# Patient Record
Sex: Female | Born: 1955
Health system: Southern US, Community
[De-identification: ages and names within clinical notes are randomized; demographics above are authoritative.]

## PROBLEM LIST (undated history)

## (undated) DIAGNOSIS — G709 Myoneural disorder, unspecified: Secondary | ICD-10-CM

## (undated) DIAGNOSIS — E079 Disorder of thyroid, unspecified: Secondary | ICD-10-CM

## (undated) DIAGNOSIS — F329 Major depressive disorder, single episode, unspecified: Secondary | ICD-10-CM

## (undated) DIAGNOSIS — C4490 Unspecified malignant neoplasm of skin, unspecified: Secondary | ICD-10-CM

## (undated) DIAGNOSIS — G8929 Other chronic pain: Secondary | ICD-10-CM

## (undated) DIAGNOSIS — M1990 Unspecified osteoarthritis, unspecified site: Secondary | ICD-10-CM

## (undated) DIAGNOSIS — K219 Gastro-esophageal reflux disease without esophagitis: Secondary | ICD-10-CM

## (undated) DIAGNOSIS — R031 Nonspecific low blood-pressure reading: Secondary | ICD-10-CM

## (undated) DIAGNOSIS — M729 Fibroblastic disorder, unspecified: Secondary | ICD-10-CM

## (undated) DIAGNOSIS — F32A Depression, unspecified: Secondary | ICD-10-CM

## (undated) DIAGNOSIS — E039 Hypothyroidism, unspecified: Secondary | ICD-10-CM

## (undated) DIAGNOSIS — F419 Anxiety disorder, unspecified: Secondary | ICD-10-CM

## (undated) DIAGNOSIS — Z9289 Personal history of other medical treatment: Secondary | ICD-10-CM

## (undated) HISTORY — PX: TOTAL KNEE ARTHROPLASTY: SHX125

## (undated) HISTORY — PX: DILATION AND CURETTAGE OF UTERUS: SHX78

## (undated) HISTORY — PX: BACK SURGERY: SHX140

## (undated) HISTORY — PX: HERNIA REPAIR: SHX51

---

## 2015-06-28 DIAGNOSIS — M545 Low back pain: Secondary | ICD-10-CM | POA: Diagnosis not present

## 2015-06-28 DIAGNOSIS — Z79899 Other long term (current) drug therapy: Secondary | ICD-10-CM | POA: Diagnosis not present

## 2015-06-28 DIAGNOSIS — M25561 Pain in right knee: Secondary | ICD-10-CM | POA: Diagnosis not present

## 2015-06-28 DIAGNOSIS — Z5181 Encounter for therapeutic drug level monitoring: Secondary | ICD-10-CM | POA: Diagnosis not present

## 2015-06-28 DIAGNOSIS — G8929 Other chronic pain: Secondary | ICD-10-CM | POA: Diagnosis not present

## 2015-06-28 DIAGNOSIS — M25562 Pain in left knee: Secondary | ICD-10-CM | POA: Diagnosis not present

## 2015-07-29 DIAGNOSIS — F4323 Adjustment disorder with mixed anxiety and depressed mood: Secondary | ICD-10-CM | POA: Diagnosis not present

## 2015-07-29 DIAGNOSIS — Z9189 Other specified personal risk factors, not elsewhere classified: Secondary | ICD-10-CM | POA: Diagnosis not present

## 2015-07-29 DIAGNOSIS — Z636 Dependent relative needing care at home: Secondary | ICD-10-CM | POA: Diagnosis not present

## 2015-08-23 DIAGNOSIS — M545 Low back pain: Secondary | ICD-10-CM | POA: Diagnosis not present

## 2015-08-23 DIAGNOSIS — M25561 Pain in right knee: Secondary | ICD-10-CM | POA: Diagnosis not present

## 2015-08-23 DIAGNOSIS — M961 Postlaminectomy syndrome, not elsewhere classified: Secondary | ICD-10-CM | POA: Diagnosis not present

## 2015-08-23 DIAGNOSIS — M25562 Pain in left knee: Secondary | ICD-10-CM | POA: Diagnosis not present

## 2015-08-25 DIAGNOSIS — R079 Chest pain, unspecified: Secondary | ICD-10-CM | POA: Diagnosis not present

## 2015-08-25 DIAGNOSIS — R002 Palpitations: Secondary | ICD-10-CM | POA: Diagnosis not present

## 2015-08-25 DIAGNOSIS — R0602 Shortness of breath: Secondary | ICD-10-CM | POA: Diagnosis not present

## 2015-09-07 DIAGNOSIS — R079 Chest pain, unspecified: Secondary | ICD-10-CM | POA: Diagnosis not present

## 2015-09-07 DIAGNOSIS — R002 Palpitations: Secondary | ICD-10-CM | POA: Diagnosis not present

## 2015-09-07 DIAGNOSIS — R0602 Shortness of breath: Secondary | ICD-10-CM | POA: Diagnosis not present

## 2015-10-18 DIAGNOSIS — M25512 Pain in left shoulder: Secondary | ICD-10-CM | POA: Diagnosis not present

## 2015-10-18 DIAGNOSIS — M069 Rheumatoid arthritis, unspecified: Secondary | ICD-10-CM | POA: Diagnosis not present

## 2015-10-18 DIAGNOSIS — M2550 Pain in unspecified joint: Secondary | ICD-10-CM | POA: Diagnosis not present

## 2015-10-18 DIAGNOSIS — M545 Low back pain: Secondary | ICD-10-CM | POA: Diagnosis not present

## 2015-10-26 DIAGNOSIS — R635 Abnormal weight gain: Secondary | ICD-10-CM | POA: Diagnosis not present

## 2015-10-26 DIAGNOSIS — E559 Vitamin D deficiency, unspecified: Secondary | ICD-10-CM | POA: Diagnosis not present

## 2015-10-26 DIAGNOSIS — I10 Essential (primary) hypertension: Secondary | ICD-10-CM | POA: Diagnosis not present

## 2015-10-26 DIAGNOSIS — E039 Hypothyroidism, unspecified: Secondary | ICD-10-CM | POA: Diagnosis not present

## 2015-10-26 DIAGNOSIS — M069 Rheumatoid arthritis, unspecified: Secondary | ICD-10-CM | POA: Diagnosis not present

## 2015-11-02 DIAGNOSIS — M25572 Pain in left ankle and joints of left foot: Secondary | ICD-10-CM | POA: Diagnosis not present

## 2015-11-02 DIAGNOSIS — S93411A Sprain of calcaneofibular ligament of right ankle, initial encounter: Secondary | ICD-10-CM | POA: Diagnosis not present

## 2015-11-02 DIAGNOSIS — G8929 Other chronic pain: Secondary | ICD-10-CM | POA: Diagnosis not present

## 2015-11-02 DIAGNOSIS — M25561 Pain in right knee: Secondary | ICD-10-CM | POA: Diagnosis not present

## 2015-11-02 DIAGNOSIS — M1712 Unilateral primary osteoarthritis, left knee: Secondary | ICD-10-CM | POA: Diagnosis not present

## 2015-11-22 DIAGNOSIS — Z1231 Encounter for screening mammogram for malignant neoplasm of breast: Secondary | ICD-10-CM | POA: Diagnosis not present

## 2015-12-02 DIAGNOSIS — Z01818 Encounter for other preprocedural examination: Secondary | ICD-10-CM | POA: Diagnosis not present

## 2015-12-02 DIAGNOSIS — M1712 Unilateral primary osteoarthritis, left knee: Secondary | ICD-10-CM | POA: Diagnosis not present

## 2015-12-02 DIAGNOSIS — M25562 Pain in left knee: Secondary | ICD-10-CM | POA: Diagnosis not present

## 2015-12-07 DIAGNOSIS — M545 Low back pain: Secondary | ICD-10-CM | POA: Diagnosis not present

## 2015-12-07 DIAGNOSIS — Z79899 Other long term (current) drug therapy: Secondary | ICD-10-CM | POA: Diagnosis not present

## 2015-12-07 DIAGNOSIS — M2550 Pain in unspecified joint: Secondary | ICD-10-CM | POA: Diagnosis not present

## 2015-12-07 DIAGNOSIS — M069 Rheumatoid arthritis, unspecified: Secondary | ICD-10-CM | POA: Diagnosis not present

## 2016-01-24 ENCOUNTER — Encounter (HOSPITAL_BASED_OUTPATIENT_CLINIC_OR_DEPARTMENT_OTHER): Payer: Self-pay | Admitting: Emergency Medicine

## 2016-01-24 ENCOUNTER — Emergency Department (HOSPITAL_BASED_OUTPATIENT_CLINIC_OR_DEPARTMENT_OTHER)
Admission: EM | Admit: 2016-01-24 | Discharge: 2016-01-24 | Disposition: A | Discharge status: Home / Self Care | Payer: PPO | Attending: Emergency Medicine | Admitting: Emergency Medicine

## 2016-01-24 ENCOUNTER — Emergency Department (HOSPITAL_BASED_OUTPATIENT_CLINIC_OR_DEPARTMENT_OTHER): Payer: PPO

## 2016-01-24 DIAGNOSIS — R109 Unspecified abdominal pain: Secondary | ICD-10-CM | POA: Diagnosis not present

## 2016-01-24 DIAGNOSIS — K5712 Diverticulitis of small intestine without perforation or abscess without bleeding: Secondary | ICD-10-CM

## 2016-01-24 DIAGNOSIS — Z79899 Other long term (current) drug therapy: Secondary | ICD-10-CM | POA: Insufficient documentation

## 2016-01-24 DIAGNOSIS — K5792 Diverticulitis of intestine, part unspecified, without perforation or abscess without bleeding: Secondary | ICD-10-CM | POA: Insufficient documentation

## 2016-01-24 HISTORY — DX: Unspecified malignant neoplasm of skin, unspecified: C44.90

## 2016-01-24 HISTORY — DX: Unspecified osteoarthritis, unspecified site: M19.90

## 2016-01-24 HISTORY — DX: Fibroblastic disorder, unspecified: M72.9

## 2016-01-24 HISTORY — DX: Disorder of thyroid, unspecified: E07.9

## 2016-01-24 LAB — CBC WITH DIFFERENTIAL/PLATELET
BASOS PCT: 0 %
Basophils Absolute: 0 10*3/uL (ref 0.0–0.1)
EOS ABS: 0.1 10*3/uL (ref 0.0–0.7)
EOS PCT: 0 %
HCT: 37.3 % (ref 36.0–46.0)
Hemoglobin: 12.2 g/dL (ref 12.0–15.0)
Lymphocytes Relative: 11 %
Lymphs Abs: 1.3 10*3/uL (ref 0.7–4.0)
MCH: 29.9 pg (ref 26.0–34.0)
MCHC: 32.7 g/dL (ref 30.0–36.0)
MCV: 91.4 fL (ref 78.0–100.0)
MONO ABS: 0.7 10*3/uL (ref 0.1–1.0)
MONOS PCT: 6 %
NEUTROS PCT: 83 %
Neutro Abs: 9.3 10*3/uL — ABNORMAL HIGH (ref 1.7–7.7)
Platelets: 246 10*3/uL (ref 150–400)
RBC: 4.08 MIL/uL (ref 3.87–5.11)
RDW: 14 % (ref 11.5–15.5)
WBC: 11.3 10*3/uL — ABNORMAL HIGH (ref 4.0–10.5)

## 2016-01-24 LAB — COMPREHENSIVE METABOLIC PANEL
ALBUMIN: 3.8 g/dL (ref 3.5–5.0)
ALK PHOS: 66 U/L (ref 38–126)
ALT: 13 U/L — ABNORMAL LOW (ref 14–54)
ANION GAP: 9 (ref 5–15)
AST: 18 U/L (ref 15–41)
BILIRUBIN TOTAL: 0.5 mg/dL (ref 0.3–1.2)
BUN: 16 mg/dL (ref 6–20)
CALCIUM: 8.6 mg/dL — AB (ref 8.9–10.3)
CO2: 20 mmol/L — ABNORMAL LOW (ref 22–32)
Chloride: 108 mmol/L (ref 101–111)
Creatinine, Ser: 0.93 mg/dL (ref 0.44–1.00)
GFR calc Af Amer: >60 mL/min (ref >60)
GFR calc non Af Amer: >60 mL/min (ref >60)
GLUCOSE: 153 mg/dL — AB (ref 65–99)
Potassium: 3.6 mmol/L (ref 3.5–5.1)
Sodium: 137 mmol/L (ref 135–145)
TOTAL PROTEIN: 7.1 g/dL (ref 6.5–8.1)

## 2016-01-24 LAB — URINALYSIS, ROUTINE W REFLEX MICROSCOPIC
Glucose, UA: NEGATIVE mg/dL
Hgb urine dipstick: NEGATIVE
Ketones, ur: NEGATIVE mg/dL
NITRITE: NEGATIVE
PH: 6 (ref 5.0–8.0)
Protein, ur: NEGATIVE mg/dL
SPECIFIC GRAVITY, URINE: 1.02 (ref 1.005–1.030)

## 2016-01-24 LAB — URINE MICROSCOPIC-ADD ON

## 2016-01-24 LAB — LIPASE, BLOOD: Lipase: 20 U/L (ref 11–51)

## 2016-01-24 MED ORDER — METRONIDAZOLE 500 MG PO TABS: 500.0000 mg | ORAL_TABLET | Freq: Two times a day (BID) | ORAL | 0 refills | Status: DC

## 2016-01-24 MED ORDER — SODIUM CHLORIDE 0.9 % IV BOLUS (SEPSIS)
1000.0000 mL | Freq: Once | INTRAVENOUS | Status: AC
  Administered 2016-01-24: 1000 mL via INTRAVENOUS

## 2016-01-24 MED ORDER — OMEPRAZOLE 20 MG PO CPDR: 20.0000 mg | DELAYED_RELEASE_CAPSULE | Freq: Every day | ORAL | 0 refills | Status: DC

## 2016-01-24 MED ORDER — CIPROFLOXACIN HCL 500 MG PO TABS: 500.0000 mg | ORAL_TABLET | Freq: Three times a day (TID) | ORAL | 0 refills | Status: DC

## 2016-01-24 MED FILL — OMEPRAZOLE DR 20 MG CAPSULE: 20 | 30 days supply | Qty: 30 | Fill #0

## 2016-01-24 MED FILL — CIPROFLOXACIN HCL 500 MG TA: 500 | 7 days supply | Qty: 21 | Fill #0

## 2016-01-24 MED FILL — metroNIDAZOLE 500 MG TABS: 500 | 7 days supply | Qty: 14 | Fill #0

## 2016-01-24 NOTE — Discharge Instructions (Signed)
Cipro and Flagyl as prescribed.  Prilosec as prescribed.  You are to follow-up at Dr. Lorie Apley office at 8:30 Wednesday morning. They should call you to schedule this appointment. The contact information for her office has been provided in this discharge summary for you to call if you have not heard from them by noon tomorrow.

## 2016-01-24 NOTE — ED Provider Notes (Signed)
[facility] Provider Note   CSN: [id number] Arrival date & time: 01/24/16  1212  First Provider Contact:  None       History   Chief Complaint Chief Complaint  Patient presents with  . Flank Pain    HPI Messina Marren is a 60 y.o. female.  Patient is a 60 year old female who presents here with complaints of right flank pain that started several days ago. It has been constant in nature and is worsening. She denies any fevers or chills. She denies any urinary complaints. She denies any vomiting, diarrhea, or constipation.   The history is provided by the patient.  Flank Pain  This is a new problem. Episode onset: 3 days ago. The problem occurs constantly. The problem has been gradually worsening. Associated symptoms include abdominal pain. Pertinent negatives include no chest pain. Nothing aggravates the symptoms. Nothing relieves the symptoms.    Past Medical History:  Diagnosis Date  . Arthritis   . Fasciitis   . Skin cancer   . Thyroid disease     There are no active problems to display for this patient.   Past Surgical History:  Procedure Laterality Date  . BACK SURGERY    . HERNIA REPAIR    . TOTAL KNEE ARTHROPLASTY      OB History    No data available       Home Medications    Prior to Admission medications   Medication Sig Start Date End Date Taking? Authorizing Provider  amitriptyline (ELAVIL) 10 MG tablet Take 10 mg by mouth at bedtime.   Yes Historical Provider, MD  cyclobenzaprine (FLEXERIL) 10 MG tablet Take 10 mg by mouth 3 (three) times daily as needed for muscle spasms.   Yes Historical Provider, MD  DULoxetine (CYMBALTA) 30 MG capsule Take 30 mg by mouth daily.   Yes Historical Provider, MD  FLUoxetine (PROZAC) 20 MG capsule Take 20 mg by mouth daily.   Yes Historical Provider, MD  HYDROmorphone (DILAUDID) 4 MG tablet Take by mouth every 4 (four) hours as needed for severe pain.   Yes Historical Provider, MD  LIOTHYRONINE SODIUM PO  Take by mouth.   Yes Historical Provider, MD  LORazepam (ATIVAN) 1 MG tablet Take 1 mg by mouth as needed for anxiety.   Yes Historical Provider, MD  meloxicam (MOBIC) 7.5 MG tablet Take 7.5 mg by mouth daily.   Yes Historical Provider, MD  oxycodone (ROXICODONE) 30 MG immediate release tablet Take 80 mg by mouth every 4 (four) hours as needed for pain.   Yes Historical Provider, MD  predniSONE (DELTASONE) 10 MG tablet Take 10 mg by mouth daily with breakfast.   Yes Historical Provider, MD  topiramate (TOPAMAX) 100 MG tablet Take 100 mg by mouth 2 (two) times daily.   Yes Historical Provider, MD    Family History History reviewed. No pertinent family history.  Social History Social History  Substance Use Topics  . Smoking status: Never Smoker  . Smokeless tobacco: Never Used  . Alcohol use No     Allergies   Penicillins   Review of Systems Review of Systems  Cardiovascular: Negative for chest pain.  Gastrointestinal: Positive for abdominal pain.  Genitourinary: Positive for flank pain.  All other systems reviewed and are negative.    Physical Exam Updated Vital Signs BP 128/70 (BP Location: Right Arm)   Pulse 112   Temp 98.4 F (36.9 C) (Oral)   Resp 18   Ht 5\' 6"  (1.676 m)  Wt 245 lb (111.1 kg)   SpO2 100%   BMI 39.54 kg/m   Physical Exam  Constitutional: She is oriented to person, place, and time. She appears well-developed and well-nourished. No distress.  HENT:  Head: Normocephalic and atraumatic.  Neck: Normal range of motion. Neck supple.  Cardiovascular: Normal rate and regular rhythm.  Exam reveals no gallop and no friction rub.   No murmur heard. Pulmonary/Chest: Effort normal and breath sounds normal. No respiratory distress. She has no wheezes.  Abdominal: Soft. Bowel sounds are normal. She exhibits no distension. There is no tenderness.  Musculoskeletal: Normal range of motion.  Neurological: She is alert and oriented to person, place, and time.    Skin: Skin is warm and dry. She is not diaphoretic.  Nursing note and vitals reviewed.    ED Treatments / Results  Labs (all labs ordered are listed, but only abnormal results are displayed) Labs Reviewed  URINALYSIS, ROUTINE W REFLEX MICROSCOPIC (NOT AT [facility]) - Abnormal; Notable for the following:       Result Value   Color, Urine AMBER (*)    APPearance CLOUDY (*)    Bilirubin Urine SMALL (*)    Leukocytes, UA SMALL (*)    All other components within normal limits  COMPREHENSIVE METABOLIC PANEL - Abnormal; Notable for the following:    CO2 20 (*)    Glucose, Bld 153 (*)    Calcium 8.6 (*)    ALT 13 (*)    All other components within normal limits  CBC WITH DIFFERENTIAL/PLATELET - Abnormal; Notable for the following:    WBC 11.3 (*)    Neutro Abs 9.3 (*)    All other components within normal limits  URINE MICROSCOPIC-ADD ON - Abnormal; Notable for the following:    Squamous Epithelial / LPF 0-5 (*)    Bacteria, UA MANY (*)    Casts HYALINE CASTS (*)    All other components within normal limits  LIPASE, BLOOD    EKG  EKG Interpretation None       Radiology Ct Renal Stone Study  Result Date: 01/24/2016 CLINICAL DATA:  Right flank pain and tenderness EXAM: CT ABDOMEN AND PELVIS WITHOUT CONTRAST TECHNIQUE: Multidetector CT imaging of the abdomen and pelvis was performed following the standard protocol without IV contrast. COMPARISON:  None. FINDINGS: Lower chest:  No acute findings. Hepatobiliary: No mass visualized on this un-enhanced exam. Pancreas: No mass or inflammatory process identified on this un-enhanced exam. Spleen: Within normal limits in size. Adrenals/Urinary Tract: No evidence of urolithiasis or hydronephrosis. Left renal cyst is identified which is incompletely characterized without IV contrast material measuring 9 mm. No kidney stones or hydronephrosis. No hydroureter. Urinary bladder is normal. No definite mass visualized on this un-enhanced exam.  Stomach/Bowel: Normal appearance of the stomach. There is a collection of gas which extends beyond the normal luminal contours of the duodenum measuring 1.9 by 1.0 cm, image 37 of series 2. There is fat stranding surrounding this collection of gas. No free perforation of gas identified within the upper abdomen. The small bowel loops are otherwise normal in appearance. There is no pathologic dilatation of the colon. Vascular/Lymphatic: Calcified atherosclerotic disease involves the abdominal aorta. No aneurysm. No enlarged retroperitoneal or mesenteric adenopathy. No enlarged pelvic or inguinal lymph nodes. Reproductive: The uterus and the adnexal structures are unremarkable. Other: There is no ascites or focal fluid collections within the abdomen or pelvis. Musculoskeletal: Status post posterior hardware fusion of the lumbar spine. IMPRESSION: 1. There  is inflammation surrounding a focal collection of gas associated with the third portion of the duodenum. This may represent a duodenal diverticulitis versus contained ulcer. Careful clinical correlation is recommended. No significant in pneumoperitoneum identified. No fluid collections identified. 2. No nephrolithiasis or obstructive uropathy. 3. Aortic atherosclerosis. Electronically Signed   By: Kerby Moors M.D.   On: 01/24/2016 13:28    Procedures Procedures (including critical care time)  Medications Ordered in ED Medications  sodium chloride 0.9 % bolus 1,000 mL (1,000 mLs Intravenous New Bag/Given 01/24/16 1307)     Initial Impression / Assessment and Plan / ED Course  I have reviewed the triage vital signs and the nursing notes.  Pertinent labs & imaging results that were available during my care of the patient were reviewed by me and considered in my medical decision making (see chart for details).  Clinical Course      Final Clinical Impressions(s) / ED Diagnoses   Final diagnoses:  None   Workup reveals no evidence for infection  in the urine. She does have a slight leukocytosis and CT scan shows either duodenal diverticulitis or possibly ulcer formation. She will be treated with Cipro and Flagyl as well as Prilosec and advised to follow-up with gastroenterology.  I have discussed the case with Dr. Collene Mares from gastroenterology who is comfortable with the disposition and will see to it that the patient gets follow-up in the next 2 days.  New Prescriptions New Prescriptions   No medications on file     Veryl Speak, MD 01/24/16 1429

## 2016-01-24 NOTE — ED Notes (Signed)
Patient transported to CT 

## 2016-02-01 DIAGNOSIS — M545 Low back pain: Secondary | ICD-10-CM | POA: Diagnosis not present

## 2016-02-01 DIAGNOSIS — M2550 Pain in unspecified joint: Secondary | ICD-10-CM | POA: Diagnosis not present

## 2016-02-01 DIAGNOSIS — M069 Rheumatoid arthritis, unspecified: Secondary | ICD-10-CM | POA: Diagnosis not present

## 2016-02-01 DIAGNOSIS — M25562 Pain in left knee: Secondary | ICD-10-CM | POA: Diagnosis not present

## 2016-02-03 ENCOUNTER — Other Ambulatory Visit: Payer: Self-pay | Admitting: Gastroenterology

## 2016-02-03 DIAGNOSIS — R935 Abnormal findings on diagnostic imaging of other abdominal regions, including retroperitoneum: Secondary | ICD-10-CM

## 2016-02-03 DIAGNOSIS — K5900 Constipation, unspecified: Secondary | ICD-10-CM | POA: Diagnosis not present

## 2016-02-03 DIAGNOSIS — K625 Hemorrhage of anus and rectum: Secondary | ICD-10-CM | POA: Diagnosis not present

## 2016-02-03 DIAGNOSIS — K219 Gastro-esophageal reflux disease without esophagitis: Secondary | ICD-10-CM | POA: Diagnosis not present

## 2016-02-03 DIAGNOSIS — R140 Abdominal distension (gaseous): Secondary | ICD-10-CM | POA: Diagnosis not present

## 2016-02-14 ENCOUNTER — Ambulatory Visit
Admission: RE | Admit: 2016-02-14 | Discharge: 2016-02-14 | Disposition: A | Discharge status: Home / Self Care | Payer: PPO | Source: Ambulatory Visit | Attending: Gastroenterology | Admitting: Gastroenterology

## 2016-02-14 ENCOUNTER — Other Ambulatory Visit: Payer: Self-pay

## 2016-02-14 DIAGNOSIS — K5732 Diverticulitis of large intestine without perforation or abscess without bleeding: Secondary | ICD-10-CM | POA: Diagnosis not present

## 2016-02-14 DIAGNOSIS — R935 Abnormal findings on diagnostic imaging of other abdominal regions, including retroperitoneum: Secondary | ICD-10-CM

## 2016-02-14 MED ORDER — IOPAMIDOL (ISOVUE-300) INJECTION 61%
125.0000 mL | Freq: Once | INTRAVENOUS | Status: AC | PRN
  Administered 2016-02-14: 125 mL via INTRAVENOUS

## 2016-02-17 DIAGNOSIS — Z1211 Encounter for screening for malignant neoplasm of colon: Secondary | ICD-10-CM | POA: Diagnosis not present

## 2016-02-17 DIAGNOSIS — Z1212 Encounter for screening for malignant neoplasm of rectum: Secondary | ICD-10-CM | POA: Diagnosis not present

## 2016-03-08 ENCOUNTER — Other Ambulatory Visit: Payer: Self-pay | Admitting: Gastroenterology

## 2016-03-16 ENCOUNTER — Encounter (HOSPITAL_COMMUNITY): Payer: Self-pay | Admitting: *Deleted

## 2016-03-20 NOTE — Anesthesia Preprocedure Evaluation (Addendum)
Anesthesia Evaluation  Patient identified by MRN, date of birth, ID band Patient awake    Reviewed: Allergy & Precautions, NPO status , Patient's Chart, lab work & pertinent test results  History of Anesthesia Complications Negative for: history of anesthetic complications  Airway Mallampati: II  TM Distance: >3 FB Neck ROM: Full    Dental  (+) Dental Advisory Given, Partial Upper,    Pulmonary neg pulmonary ROS,    Pulmonary exam normal breath sounds clear to auscultation       Cardiovascular (-) anginanegative cardio ROS   Rhythm:Regular Rate:Normal     Neuro/Psych neg Seizures PSYCHIATRIC DISORDERS Anxiety Depression chronic pain from RA, on hydromorphone; spinal stenosis s/p L5-S1 fusion   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)    GI/Hepatic Neg liver ROS, GERD  Medicated,  Endo/Other  neg diabetesHypothyroidism Morbid obesity  Renal/GU negative Renal ROS     Musculoskeletal  (+) Arthritis , Rheumatoid disorders,  Fibromyalgia -  Abdominal (+) + obese,   Peds  Hematology negative hematology ROS (+)   Anesthesia Other Findings   Reproductive/Obstetrics                           Anesthesia Physical Anesthesia Plan  ASA: III  Anesthesia Plan: MAC   Post-op Pain Management:    Induction: Intravenous  Airway Management Planned: Natural Airway and Nasal Cannula  Additional Equipment:   Intra-op Plan:   Post-operative Plan:   Informed Consent: I have reviewed the patients History and Physical, chart, labs and discussed the procedure including the risks, benefits and alternatives for the proposed anesthesia with the patient or authorized representative who has indicated his/her understanding and acceptance.   Dental advisory given  Plan Discussed with: CRNA  Anesthesia Plan Comments:        Anesthesia Quick Evaluation

## 2016-03-21 ENCOUNTER — Ambulatory Visit (HOSPITAL_COMMUNITY): Payer: PPO | Admitting: Anesthesiology

## 2016-03-21 ENCOUNTER — Encounter (HOSPITAL_COMMUNITY)
Admission: RE | Disposition: A | Discharge status: Home / Self Care | Payer: Self-pay | Source: Ambulatory Visit | Attending: Gastroenterology

## 2016-03-21 ENCOUNTER — Encounter (HOSPITAL_COMMUNITY): Payer: Self-pay

## 2016-03-21 ENCOUNTER — Ambulatory Visit (HOSPITAL_COMMUNITY)
Admission: RE | Admit: 2016-03-21 | Discharge: 2016-03-21 | Disposition: A | Discharge status: Home / Self Care | Payer: PPO | Source: Ambulatory Visit | Attending: Gastroenterology | Admitting: Gastroenterology

## 2016-03-21 DIAGNOSIS — F329 Major depressive disorder, single episode, unspecified: Secondary | ICD-10-CM | POA: Diagnosis not present

## 2016-03-21 DIAGNOSIS — R151 Fecal smearing: Secondary | ICD-10-CM | POA: Diagnosis not present

## 2016-03-21 DIAGNOSIS — Z981 Arthrodesis status: Secondary | ICD-10-CM | POA: Insufficient documentation

## 2016-03-21 DIAGNOSIS — M069 Rheumatoid arthritis, unspecified: Secondary | ICD-10-CM | POA: Diagnosis not present

## 2016-03-21 DIAGNOSIS — Z6839 Body mass index (BMI) 39.0-39.9, adult: Secondary | ICD-10-CM | POA: Diagnosis not present

## 2016-03-21 DIAGNOSIS — M729 Fibroblastic disorder, unspecified: Secondary | ICD-10-CM | POA: Diagnosis not present

## 2016-03-21 DIAGNOSIS — K635 Polyp of colon: Secondary | ICD-10-CM | POA: Diagnosis not present

## 2016-03-21 DIAGNOSIS — K219 Gastro-esophageal reflux disease without esophagitis: Secondary | ICD-10-CM | POA: Diagnosis not present

## 2016-03-21 DIAGNOSIS — Z85828 Personal history of other malignant neoplasm of skin: Secondary | ICD-10-CM | POA: Diagnosis not present

## 2016-03-21 DIAGNOSIS — Z880 Allergy status to penicillin: Secondary | ICD-10-CM | POA: Diagnosis not present

## 2016-03-21 DIAGNOSIS — Z1211 Encounter for screening for malignant neoplasm of colon: Secondary | ICD-10-CM | POA: Diagnosis not present

## 2016-03-21 DIAGNOSIS — G8929 Other chronic pain: Secondary | ICD-10-CM | POA: Diagnosis not present

## 2016-03-21 DIAGNOSIS — D125 Benign neoplasm of sigmoid colon: Secondary | ICD-10-CM | POA: Insufficient documentation

## 2016-03-21 DIAGNOSIS — F419 Anxiety disorder, unspecified: Secondary | ICD-10-CM | POA: Diagnosis not present

## 2016-03-21 DIAGNOSIS — G709 Myoneural disorder, unspecified: Secondary | ICD-10-CM | POA: Insufficient documentation

## 2016-03-21 DIAGNOSIS — D127 Benign neoplasm of rectosigmoid junction: Secondary | ICD-10-CM | POA: Diagnosis not present

## 2016-03-21 DIAGNOSIS — D123 Benign neoplasm of transverse colon: Secondary | ICD-10-CM | POA: Diagnosis not present

## 2016-03-21 DIAGNOSIS — K621 Rectal polyp: Secondary | ICD-10-CM | POA: Diagnosis not present

## 2016-03-21 DIAGNOSIS — E039 Hypothyroidism, unspecified: Secondary | ICD-10-CM | POA: Insufficient documentation

## 2016-03-21 DIAGNOSIS — Z96651 Presence of right artificial knee joint: Secondary | ICD-10-CM | POA: Diagnosis not present

## 2016-03-21 HISTORY — DX: Hypothyroidism, unspecified: E03.9

## 2016-03-21 HISTORY — DX: Major depressive disorder, single episode, unspecified: F32.9

## 2016-03-21 HISTORY — DX: Personal history of other medical treatment: Z92.89

## 2016-03-21 HISTORY — DX: Anxiety disorder, unspecified: F41.9

## 2016-03-21 HISTORY — DX: Other chronic pain: G89.29

## 2016-03-21 HISTORY — DX: Nonspecific low blood-pressure reading: R03.1

## 2016-03-21 HISTORY — DX: Depression, unspecified: F32.A

## 2016-03-21 HISTORY — DX: Myoneural disorder, unspecified: G70.9

## 2016-03-21 HISTORY — PX: COLONOSCOPY WITH PROPOFOL: SHX5780

## 2016-03-21 HISTORY — DX: Gastro-esophageal reflux disease without esophagitis: K21.9

## 2016-03-21 SURGERY — COLONOSCOPY WITH PROPOFOL
Anesthesia: Monitor Anesthesia Care

## 2016-03-21 MED ORDER — SODIUM CHLORIDE 0.9 % IV SOLN: INTRAVENOUS | Status: DC

## 2016-03-21 MED ORDER — LIDOCAINE 2% (20 MG/ML) 5 ML SYRINGE
INTRAMUSCULAR | Status: AC
  Filled 2016-03-21: qty 10

## 2016-03-21 MED ORDER — ONDANSETRON HCL 4 MG/2ML IJ SOLN
INTRAMUSCULAR | Status: DC | PRN
  Administered 2016-03-21: 4 mg via INTRAVENOUS

## 2016-03-21 MED ORDER — LIDOCAINE 2% (20 MG/ML) 5 ML SYRINGE
INTRAMUSCULAR | Status: DC | PRN
  Administered 2016-03-21: 40 mg via INTRAVENOUS

## 2016-03-21 MED ORDER — LACTATED RINGERS IV SOLN
INTRAVENOUS | Status: DC
  Administered 2016-03-21: 1000 mL via INTRAVENOUS

## 2016-03-21 MED ORDER — PROPOFOL 10 MG/ML IV BOLUS
INTRAVENOUS | Status: AC
  Filled 2016-03-21: qty 60

## 2016-03-21 MED ORDER — PROPOFOL 500 MG/50ML IV EMUL
INTRAVENOUS | Status: DC | PRN
  Administered 2016-03-21: 150 ug/kg/min via INTRAVENOUS

## 2016-03-21 MED ORDER — ONDANSETRON HCL 4 MG/2ML IJ SOLN
INTRAMUSCULAR | Status: AC
  Filled 2016-03-21: qty 2

## 2016-03-21 MED ORDER — PROPOFOL 10 MG/ML IV BOLUS
INTRAVENOUS | Status: DC | PRN
  Administered 2016-03-21 (×3): 20 mg via INTRAVENOUS

## 2016-03-21 SURGICAL SUPPLY — 21 items

## 2016-03-21 NOTE — Anesthesia Postprocedure Evaluation (Signed)
Anesthesia Post Note  Patient: Raiya Reyburn  Procedure(s) Performed: Procedure(s) (LRB): COLONOSCOPY WITH PROPOFOL (N/A)  Patient location during evaluation: PACU Anesthesia Type: MAC Level of consciousness: awake and alert Pain management: pain level controlled Vital Signs Assessment: post-procedure vital signs reviewed and stable Respiratory status: spontaneous breathing, nonlabored ventilation and respiratory function stable Cardiovascular status: stable and blood pressure returned to baseline Anesthetic complications: no    Last Vitals:  Vitals:   03/21/16 0810 03/21/16 0830  BP: 116/63 102/65  Pulse:  73  Resp:    Temp:      Last Pain:  Vitals:   03/21/16 0802  TempSrc: Oral  PainSc: 2                  Nilda Simmer

## 2016-03-21 NOTE — H&P (Signed)
Sandy Hernandez is an 60 y.o. female.   Chief Complaint: Colorectal cancer screening. HPI: 60 year old white female here for a screening colonoscopy. See office notes for details.  Past Medical History:  Diagnosis Date  . Anxiety    stressers-caring for spouse who has Alzheimers  . Arthritis    Rheumatoid arthritis- Dr. Nelva Bush, [facility]  . Blood pressure abnormally low    this is normal  . Chronic pain    due to rheumatoid arthritis  . Depression   . Fasciitis   . GERD (gastroesophageal reflux disease)   . Hypothyroidism   . Neuromuscular disorder ([place])    drop foot left leg-no brace currently-tingling only  . Skin cancer   . Thyroid disease   . Transfusion history    Past Surgical History:  Procedure Laterality Date  . BACK SURGERY     lower lumbar fusion 2014  . DILATION AND CURETTAGE OF UTERUS    . HERNIA REPAIR     umbilical hernia repair  . TOTAL KNEE ARTHROPLASTY Right    History reviewed. No pertinent family history. Social History:  reports that she has never smoked. She has never used smokeless tobacco. She reports that she does not drink alcohol or use drugs.  Allergies:  Allergies  Allergen Reactions  . Penicillins Other (See Comments)    Unknown     Medications Prior to Admission  Medication Sig Dispense Refill  . amitriptyline (ELAVIL) 10 MG tablet Take 10 mg by mouth at bedtime.    . ciprofloxacin (CIPRO) 500 MG tablet Take 1 tablet (500 mg total) by mouth 3 (three) times daily. 21 tablet 0  . DULoxetine (CYMBALTA) 30 MG capsule Take 30 mg by mouth daily.    Marland Kitchen FLUoxetine (PROZAC) 20 MG capsule Take 20 mg by mouth daily.    Marland Kitchen HYDROmorphone (DILAUDID) 4 MG tablet Take by mouth every 4 (four) hours as needed for severe pain.    Marland Kitchen LIOTHYRONINE SODIUM PO Take by mouth.    Marland Kitchen LORazepam (ATIVAN) 1 MG tablet Take 1 mg by mouth as needed for anxiety.    . meloxicam (MOBIC) 7.5 MG tablet Take 7.5 mg by mouth daily.    . metroNIDAZOLE (FLAGYL) 500 MG tablet  Take 1 tablet (500 mg total) by mouth 2 (two) times daily. One po bid x 7 days 14 tablet 0  . omeprazole (PRILOSEC) 20 MG capsule Take 1 capsule (20 mg total) by mouth daily. 30 capsule 0  . oxycodone (ROXICODONE) 30 MG immediate release tablet Take 80 mg by mouth every 4 (four) hours as needed for pain.    . predniSONE (DELTASONE) 10 MG tablet Take 10 mg by mouth daily with breakfast.    . topiramate (TOPAMAX) 100 MG tablet Take 100 mg by mouth 2 (two) times daily.    . cyclobenzaprine (FLEXERIL) 10 MG tablet Take 10 mg by mouth 3 (three) times daily as needed for muscle spasms.      No results found for this or any previous visit (from the past 48 hour(s)). No results found.  Review of Systems  Eyes: Negative.   Respiratory: Negative.   Cardiovascular: Negative.   Gastrointestinal: Positive for constipation and heartburn.  Neurological: Negative.   Endo/Heme/Allergies: Negative.   Psychiatric/Behavioral: Positive for depression. The patient is nervous/anxious.     Blood pressure (!) 93/58, pulse 79, temperature 98.3 F (36.8 C), temperature source Oral, resp. rate 12, height 5\' 6"  (1.676 m), weight 111.1 kg (245 lb), SpO2 99 %.  Physical Exam  Constitutional: She is oriented to person, place, and time. She appears well-developed and well-nourished.  morbidly obese  HENT:  Head: Normocephalic and atraumatic.  Eyes: Conjunctivae and EOM are normal. Pupils are equal, round, and reactive to light.  Neck: Neck supple.  Cardiovascular: Normal rate and regular rhythm.   Respiratory: Effort normal and breath sounds normal.  GI: Soft. Bowel sounds are normal.  Neurological: She is alert and oriented to person, place, and time.  Skin: Skin is warm and dry.    Assessment/Plan Colorectal cancer screening: proceed with a colonoscopy at this time.  Salaya Holtrop, MD 03/21/2016, 7:09 AM

## 2016-03-21 NOTE — Discharge Instructions (Signed)
YOU HAD AN ENDOSCOPIC PROCEDURE TODAY: Refer to the procedure report and other information in the discharge instructions given to you for any specific questions about what was found during the examination. If this information does not answer your questions, please call [facility] at [phone] to clarify.  ° °YOU SHOULD EXPECT: Some feelings of bloating in the abdomen. Passage of more gas than usual. Walking can help get rid of the air that was put into your GI tract during the procedure and reduce the bloating. If you had a lower endoscopy (such as a colonoscopy or flexible sigmoidoscopy) you may notice spotting of blood in your stool or on the toilet paper. Some abdominal soreness may be present for a day or two, also. ° °DIET: Your first meal following the procedure should be a light meal and then it is ok to progress to your normal diet. A half-sandwich or bowl of soup is an example of a good first meal. Heavy or fried foods are harder to digest and may make you feel nauseous or bloated. Drink plenty of fluids but you should avoid alcoholic beverages for 24 hours. If you had an esophageal dilation, please see attached information for diet.  ° °ACTIVITY: Your care partner should take you home directly after the procedure. You should plan to take it easy, moving slowly for the rest of the day. You can resume normal activity the day after the procedure however YOU SHOULD NOT DRIVE, use power tools, machinery or perform tasks that involve climbing or major physical exertion for 24 hours (because of the sedation medicines used during the test).  ° °SYMPTOMS TO REPORT IMMEDIATELY: °A gastroenterologist can be reached at any hour. Please call [phone]  for any of the following symptoms:  °Following lower endoscopy (colonoscopy, flexible sigmoidoscopy) °Excessive amounts of blood in the stool  °Significant tenderness, worsening of abdominal pains  °Swelling of the abdomen that is new, acute  °Fever of  100° or higher  °Following upper endoscopy (EGD, EUS, ERCP, esophageal dilation) °Vomiting of blood or coffee ground material  °New, significant abdominal pain  °New, significant chest pain or pain under the shoulder blades  °Painful or persistently difficult swallowing  °New shortness of breath  °Black, tarry-looking or red, bloody stools ° °FOLLOW UP:  °If any biopsies were taken you will be contacted by phone or by letter within the next 1-3 weeks. Call [phone]  if you have not heard about the biopsies in 3 weeks.  °Please also call with any specific questions about appointments or follow up tests. ° °

## 2016-03-21 NOTE — Transfer of Care (Signed)
Immediate Anesthesia Transfer of Care Note  Patient: Sandy Hernandez  Procedure(s) Performed: Procedure(s): COLONOSCOPY WITH PROPOFOL (N/A)  Patient Location: PACU  Anesthesia Type:MAC  Level of Consciousness:  sedated, patient cooperative and responds to stimulation  Airway & Oxygen Therapy:Patient Spontanous Breathing and Patient connected to face mask oxgen  Post-op Assessment:  Report given to PACU RN and Post -op Vital signs reviewed and stable  Post vital signs:  Reviewed and stable  Last Vitals:  Vitals:   03/21/16 0651  BP: (!) 93/58  Pulse: 79  Resp: 12  Temp: [id number] C    Complications: No apparent anesthesia complications

## 2016-03-21 NOTE — Op Note (Signed)
[facility] Patient Name: Sandy Hernandez Procedure Date: 03/21/2016 MRN: [id number] Attending MD: Juanita Craver , MD Date of Birth: 1956-06-13 CSN: [id number] Age: 60 Admit Type: Outpatient Procedure:                Colonoscopy with cold biopsy x 1 and hot snare                            polypectomy x 4. Indications:              CRC screening for colorectal malignant                            neoplasm-positive Cologaurd stool, DNA test. Providers:                Juanita Craver, MD, Laverta Baltimore RN, RN, Alfonso Patten, Technician, Marla Roe, CRNA. Referring MD:             Loletta Parish, MD & Verdell Face, MD Medicines:                Monitored asnesthesia care Complications:            No immediate complications. Estimated Blood Loss:     Estimated blood loss: none. Procedure:                Pre-anesthesia assessment: - Prior to the                            procedure, a history and physical was performed,                            and patient medications and allergies were                            reviewed. The patient's tolerance of previous                            anesthesia was also reviewed. The risks and                            benefits of the procedure and the sedation options                            and risks were discussed with the patient. All                            questions were answered, and informed consent was                            obtained. Prior anticoagulants: The patient has                            taken no previous anticoagulant or antiplatelet  agents. ASA Grade assessment: III - A patient with                            severe systemic disease. After reviewing the risks                            and benefits, the patient was deemed in                            satisfactory condition to undergo the procedure.                            After obtaining informed consent, the  colonoscope                            was passed under direct vision. Throughout the                            procedure, the patient's blood pressure, pulse, and                            oxygen saturations were monitored continuously. The                            EC-[id number] scope was introduced through                            the anus and advanced to the the cecum, identified                            by appendiceal orifice and ileocecal valve. The                            colonoscopy was performed with moderate difficulty                            due to inadequate bowel prep. Completion of the                            procedure was aided by lavage. The patient                            tolerated the procedure well. The quality of the                            bowel preparation was fair at best after multiple                            washes. The ileocecal valve, the appendiceal                            orifice and the rectum were photographed. The bowel  preparation used was GoLYTELY. Scope In: 7:25:48 AM Scope Out: 7:54:05 AM Scope Withdrawal Time: 0 hours 9 minutes 21 seconds  Total Procedure Duration: 0 hours 28 minutes 17 seconds  Findings:      A 7 mm sessile polyp was found in the sigmoid colon; the polyp was       removed with a hot snare x 1-200/20; resection and retrieval were       complete.      Two 6-7 mm sessile polyps were found in the recto-sigmoid colon; these       polyps were removed with a hot snare x 2-200/20; resection and retrieval       were complete.      A 8 mm sessile polyp was found in the proximal transverse colon; the       polyp was removed with a hot snare x 06-2000/0; resection and retrieval       were complete.      A small sessile polyp was found in the proximal transverse colon-this       was removed by cold biopsy x 1.      No additional abnormalities were found on retroflexion. Impression:                - Preparation of the colon was fair, at best after                            aggressive lavage; polyps could be missed.                           - One 7 mm polyp in the sigmoid colon, removed with                            a hot snare x 1; resected and retrieved.                           - Two polyps at the recto-sigmoid colon, removed                            with a hot snare x 2; resected and retrieved.                           - One 8 mm polyp in the proximal transverse colon,                            removed with a hot snare x 1; resected and                            retrieved.                           - One small sessile polyp in the proximal                            transverse colon-removed by cold biopsy x 1 . Moderate Sedation:      MAC given. Recommendation:           - High fiber, low fat diet  with augmented water                            consumption daily.                           - Continue present medications.                           - Await pathology results.                           - Repeat colonoscopy in 3 years for surveillance                            with a 2 week prep.                           - Return to GI office PRN.                           - If the patient has any abnormal GI symptoms in                            the interim, she has been advised to call the                            office ASAP for further recommendations. Procedure Code(s):        --- Professional ---                           [phone], Colonoscopy, flexible; with removal of                            tumor(s), polyp(s), or other lesion(s) by snare                            technique Diagnosis Code(s):        --- Professional ---                           D12.3, Benign neoplasm of transverse colon (hepatic                            flexure or splenic flexure)                           D12.5, Benign neoplasm of sigmoid colon                           D12.7,  Benign neoplasm of rectosigmoid junction                           Z12.11, Encounter for screening for malignant  neoplasm of colon CPT copyright 2016 American Medical Association. All rights reserved. The codes documented in this report are preliminary and upon coder review may  be revised to meet current compliance requirements. Juanita Craver, MD Juanita Craver, MD 03/21/2016 8:13:48 AM This report has been signed electronically. Number of Addenda: 0

## 2016-03-22 ENCOUNTER — Encounter (HOSPITAL_COMMUNITY): Payer: Self-pay | Admitting: Gastroenterology

## 2016-03-28 DIAGNOSIS — M545 Low back pain: Secondary | ICD-10-CM | POA: Diagnosis not present

## 2016-03-28 DIAGNOSIS — M2550 Pain in unspecified joint: Secondary | ICD-10-CM | POA: Diagnosis not present

## 2016-03-28 DIAGNOSIS — M069 Rheumatoid arthritis, unspecified: Secondary | ICD-10-CM | POA: Diagnosis not present

## 2016-03-28 DIAGNOSIS — Z79899 Other long term (current) drug therapy: Secondary | ICD-10-CM | POA: Diagnosis not present

## 2016-04-19 DIAGNOSIS — Z23 Encounter for immunization: Secondary | ICD-10-CM | POA: Diagnosis not present

## 2016-05-24 DIAGNOSIS — M069 Rheumatoid arthritis, unspecified: Secondary | ICD-10-CM | POA: Diagnosis not present

## 2016-05-24 DIAGNOSIS — M2550 Pain in unspecified joint: Secondary | ICD-10-CM | POA: Diagnosis not present

## 2016-05-24 DIAGNOSIS — M545 Low back pain: Secondary | ICD-10-CM | POA: Diagnosis not present

## 2016-05-24 DIAGNOSIS — M25512 Pain in left shoulder: Secondary | ICD-10-CM | POA: Diagnosis not present

## 2016-05-24 DIAGNOSIS — M62838 Other muscle spasm: Secondary | ICD-10-CM | POA: Diagnosis not present

## 2016-07-19 DIAGNOSIS — H5213 Myopia, bilateral: Secondary | ICD-10-CM | POA: Diagnosis not present

## 2016-08-15 DIAGNOSIS — Z636 Dependent relative needing care at home: Secondary | ICD-10-CM | POA: Diagnosis not present

## 2016-08-15 DIAGNOSIS — H66003 Acute suppurative otitis media without spontaneous rupture of ear drum, bilateral: Secondary | ICD-10-CM | POA: Diagnosis not present

## 2016-08-15 DIAGNOSIS — J329 Chronic sinusitis, unspecified: Secondary | ICD-10-CM | POA: Diagnosis not present

## 2016-08-15 DIAGNOSIS — Z880 Allergy status to penicillin: Secondary | ICD-10-CM | POA: Diagnosis not present

## 2016-08-23 DIAGNOSIS — M069 Rheumatoid arthritis, unspecified: Secondary | ICD-10-CM | POA: Diagnosis not present

## 2016-08-23 DIAGNOSIS — Z79899 Other long term (current) drug therapy: Secondary | ICD-10-CM | POA: Diagnosis not present

## 2016-08-23 DIAGNOSIS — M2550 Pain in unspecified joint: Secondary | ICD-10-CM | POA: Diagnosis not present

## 2016-08-23 DIAGNOSIS — M791 Myalgia: Secondary | ICD-10-CM | POA: Diagnosis not present

## 2016-08-23 DIAGNOSIS — Z5181 Encounter for therapeutic drug level monitoring: Secondary | ICD-10-CM | POA: Diagnosis not present

## 2016-08-23 DIAGNOSIS — M545 Low back pain: Secondary | ICD-10-CM | POA: Diagnosis not present

## 2016-08-23 DIAGNOSIS — G8929 Other chronic pain: Secondary | ICD-10-CM | POA: Diagnosis not present

## 2016-09-20 DIAGNOSIS — E559 Vitamin D deficiency, unspecified: Secondary | ICD-10-CM | POA: Diagnosis not present

## 2016-09-20 DIAGNOSIS — F331 Major depressive disorder, recurrent, moderate: Secondary | ICD-10-CM | POA: Diagnosis not present

## 2016-09-20 DIAGNOSIS — M069 Rheumatoid arthritis, unspecified: Secondary | ICD-10-CM | POA: Diagnosis not present

## 2016-09-20 DIAGNOSIS — R635 Abnormal weight gain: Secondary | ICD-10-CM | POA: Diagnosis not present

## 2016-09-20 DIAGNOSIS — E079 Disorder of thyroid, unspecified: Secondary | ICD-10-CM | POA: Diagnosis not present

## 2016-09-20 DIAGNOSIS — G894 Chronic pain syndrome: Secondary | ICD-10-CM | POA: Diagnosis not present

## 2016-09-20 DIAGNOSIS — E039 Hypothyroidism, unspecified: Secondary | ICD-10-CM | POA: Diagnosis not present

## 2016-10-18 DIAGNOSIS — F3341 Major depressive disorder, recurrent, in partial remission: Secondary | ICD-10-CM | POA: Diagnosis not present

## 2016-10-18 DIAGNOSIS — G4700 Insomnia, unspecified: Secondary | ICD-10-CM | POA: Diagnosis not present

## 2016-10-20 DIAGNOSIS — Z87891 Personal history of nicotine dependence: Secondary | ICD-10-CM | POA: Diagnosis not present

## 2016-10-20 DIAGNOSIS — M4316 Spondylolisthesis, lumbar region: Secondary | ICD-10-CM | POA: Diagnosis not present

## 2016-10-20 DIAGNOSIS — M4726 Other spondylosis with radiculopathy, lumbar region: Secondary | ICD-10-CM | POA: Diagnosis not present

## 2016-10-20 DIAGNOSIS — Z880 Allergy status to penicillin: Secondary | ICD-10-CM | POA: Diagnosis not present

## 2016-10-20 DIAGNOSIS — M4180 Other forms of scoliosis, site unspecified: Secondary | ICD-10-CM | POA: Diagnosis not present

## 2016-10-20 DIAGNOSIS — M48061 Spinal stenosis, lumbar region without neurogenic claudication: Secondary | ICD-10-CM | POA: Diagnosis not present

## 2016-10-20 DIAGNOSIS — M545 Low back pain: Secondary | ICD-10-CM | POA: Diagnosis not present

## 2016-10-20 DIAGNOSIS — Z79899 Other long term (current) drug therapy: Secondary | ICD-10-CM | POA: Diagnosis not present

## 2016-10-20 DIAGNOSIS — Z981 Arthrodesis status: Secondary | ICD-10-CM | POA: Diagnosis not present

## 2016-11-08 DIAGNOSIS — Z79899 Other long term (current) drug therapy: Secondary | ICD-10-CM | POA: Diagnosis not present

## 2016-11-08 DIAGNOSIS — M25512 Pain in left shoulder: Secondary | ICD-10-CM | POA: Diagnosis not present

## 2016-11-08 DIAGNOSIS — Z5181 Encounter for therapeutic drug level monitoring: Secondary | ICD-10-CM | POA: Diagnosis not present

## 2016-11-08 DIAGNOSIS — M797 Fibromyalgia: Secondary | ICD-10-CM | POA: Diagnosis not present

## 2016-11-08 DIAGNOSIS — M069 Rheumatoid arthritis, unspecified: Secondary | ICD-10-CM | POA: Diagnosis not present

## 2016-11-08 DIAGNOSIS — M2550 Pain in unspecified joint: Secondary | ICD-10-CM | POA: Diagnosis not present

## 2016-11-08 DIAGNOSIS — M62838 Other muscle spasm: Secondary | ICD-10-CM | POA: Diagnosis not present

## 2016-11-10 DIAGNOSIS — R600 Localized edema: Secondary | ICD-10-CM | POA: Diagnosis not present

## 2016-11-10 DIAGNOSIS — M79605 Pain in left leg: Secondary | ICD-10-CM | POA: Diagnosis not present

## 2016-11-21 DIAGNOSIS — M5417 Radiculopathy, lumbosacral region: Secondary | ICD-10-CM | POA: Diagnosis not present

## 2016-12-08 DIAGNOSIS — M7989 Other specified soft tissue disorders: Secondary | ICD-10-CM | POA: Diagnosis not present

## 2016-12-08 DIAGNOSIS — M79605 Pain in left leg: Secondary | ICD-10-CM | POA: Diagnosis not present

## 2017-01-09 DIAGNOSIS — R4681 Obsessive-compulsive behavior: Secondary | ICD-10-CM | POA: Diagnosis not present

## 2017-01-09 DIAGNOSIS — F322 Major depressive disorder, single episode, severe without psychotic features: Secondary | ICD-10-CM | POA: Diagnosis not present

## 2017-01-09 DIAGNOSIS — F419 Anxiety disorder, unspecified: Secondary | ICD-10-CM | POA: Diagnosis not present

## 2017-01-10 DIAGNOSIS — M545 Low back pain: Secondary | ICD-10-CM | POA: Diagnosis not present

## 2017-01-10 DIAGNOSIS — M2550 Pain in unspecified joint: Secondary | ICD-10-CM | POA: Diagnosis not present

## 2017-01-10 DIAGNOSIS — M069 Rheumatoid arthritis, unspecified: Secondary | ICD-10-CM | POA: Diagnosis not present

## 2017-01-10 DIAGNOSIS — M62838 Other muscle spasm: Secondary | ICD-10-CM | POA: Diagnosis not present

## 2017-01-26 ENCOUNTER — Encounter (HOSPITAL_BASED_OUTPATIENT_CLINIC_OR_DEPARTMENT_OTHER): Payer: Self-pay | Admitting: *Deleted

## 2017-01-26 ENCOUNTER — Emergency Department (HOSPITAL_BASED_OUTPATIENT_CLINIC_OR_DEPARTMENT_OTHER): Payer: PPO

## 2017-01-26 ENCOUNTER — Emergency Department (HOSPITAL_BASED_OUTPATIENT_CLINIC_OR_DEPARTMENT_OTHER)
Admission: EM | Admit: 2017-01-26 | Discharge: 2017-01-26 | Disposition: A | Discharge status: Home / Self Care | Payer: PPO | Attending: Emergency Medicine | Admitting: Emergency Medicine

## 2017-01-26 DIAGNOSIS — M79662 Pain in left lower leg: Secondary | ICD-10-CM | POA: Diagnosis present

## 2017-01-26 DIAGNOSIS — S81802A Unspecified open wound, left lower leg, initial encounter: Secondary | ICD-10-CM | POA: Diagnosis not present

## 2017-01-26 DIAGNOSIS — Z7952 Long term (current) use of systemic steroids: Secondary | ICD-10-CM | POA: Insufficient documentation

## 2017-01-26 DIAGNOSIS — M069 Rheumatoid arthritis, unspecified: Secondary | ICD-10-CM | POA: Diagnosis not present

## 2017-01-26 DIAGNOSIS — L03116 Cellulitis of left lower limb: Secondary | ICD-10-CM

## 2017-01-26 DIAGNOSIS — E039 Hypothyroidism, unspecified: Secondary | ICD-10-CM | POA: Insufficient documentation

## 2017-01-26 DIAGNOSIS — Z79899 Other long term (current) drug therapy: Secondary | ICD-10-CM | POA: Insufficient documentation

## 2017-01-26 DIAGNOSIS — R0602 Shortness of breath: Secondary | ICD-10-CM | POA: Diagnosis not present

## 2017-01-26 LAB — URINALYSIS, ROUTINE W REFLEX MICROSCOPIC
Bilirubin Urine: NEGATIVE
Glucose, UA: NEGATIVE mg/dL
Hgb urine dipstick: NEGATIVE
Ketones, ur: NEGATIVE mg/dL
NITRITE: NEGATIVE
PROTEIN: NEGATIVE mg/dL
SPECIFIC GRAVITY, URINE: 1.021 (ref 1.005–1.030)
pH: 6 (ref 5.0–8.0)

## 2017-01-26 LAB — COMPREHENSIVE METABOLIC PANEL
ALBUMIN: 3.8 g/dL (ref 3.5–5.0)
ALT: 17 U/L (ref 14–54)
ANION GAP: 9 (ref 5–15)
AST: 24 U/L (ref 15–41)
Alkaline Phosphatase: 69 U/L (ref 38–126)
BILIRUBIN TOTAL: 0.4 mg/dL (ref 0.3–1.2)
BUN: 15 mg/dL (ref 6–20)
CO2: 24 mmol/L (ref 22–32)
Calcium: 8.3 mg/dL — ABNORMAL LOW (ref 8.9–10.3)
Chloride: 104 mmol/L (ref 101–111)
Creatinine, Ser: 0.77 mg/dL (ref 0.44–1.00)
GFR calc Af Amer: >60 mL/min (ref >60)
Glucose, Bld: 118 mg/dL — ABNORMAL HIGH (ref 65–99)
POTASSIUM: 3.9 mmol/L (ref 3.5–5.1)
Sodium: 137 mmol/L (ref 135–145)
TOTAL PROTEIN: 7 g/dL (ref 6.5–8.1)

## 2017-01-26 LAB — URINALYSIS, MICROSCOPIC (REFLEX)

## 2017-01-26 LAB — CBC WITH DIFFERENTIAL/PLATELET
BASOS PCT: 0 %
Basophils Absolute: 0 10*3/uL (ref 0.0–0.1)
EOS PCT: 0 %
Eosinophils Absolute: 0 10*3/uL (ref 0.0–0.7)
HCT: 34.5 % — ABNORMAL LOW (ref 36.0–46.0)
Hemoglobin: 11.2 g/dL — ABNORMAL LOW (ref 12.0–15.0)
Lymphocytes Relative: 11 %
Lymphs Abs: 1.2 10*3/uL (ref 0.7–4.0)
MCH: 29.2 pg (ref 26.0–34.0)
MCHC: 32.5 g/dL (ref 30.0–36.0)
MCV: 90.1 fL (ref 78.0–100.0)
MONO ABS: 0.9 10*3/uL (ref 0.1–1.0)
MONOS PCT: 8 %
NEUTROS ABS: 9.2 10*3/uL — AB (ref 1.7–7.7)
Neutrophils Relative %: 81 %
PLATELETS: 237 10*3/uL (ref 150–400)
RBC: 3.83 MIL/uL — ABNORMAL LOW (ref 3.87–5.11)
RDW: 14.8 % (ref 11.5–15.5)
WBC: 11.4 10*3/uL — ABNORMAL HIGH (ref 4.0–10.5)

## 2017-01-26 LAB — I-STAT CG4 LACTIC ACID, ED: LACTIC ACID, VENOUS: 1.19 mmol/L (ref 0.5–1.9)

## 2017-01-26 MED ORDER — SODIUM CHLORIDE 0.9 % IV SOLN
INTRAVENOUS | Status: DC
  Administered 2017-01-26: 14:00:00 via INTRAVENOUS

## 2017-01-26 MED ORDER — CLINDAMYCIN HCL 150 MG PO CAPS
450.0000 mg | ORAL_CAPSULE | Freq: Three times a day (TID) | ORAL | 0 refills | Status: AC
Start: 2017-01-26 — End: 2017-02-02

## 2017-01-26 MED FILL — CLINDAMYCIN HCL 150 MG CAP: 150 | 7 days supply | Qty: 63 | Fill #0

## 2017-01-26 NOTE — ED Notes (Signed)
Pt went to xray will place on monitor when she returns

## 2017-01-26 NOTE — Discharge Instructions (Signed)
Take the clindamycin as directed for the next 7 days. For the cellulitis in the left leg. Return for any new or worse symptoms. Blood cultures are pending and will be notified if they're abnormal.

## 2017-01-26 NOTE — ED Triage Notes (Signed)
Pain in her left leg. States she has arthritis and has had a flare up of same pain in the past. Pain got worse yesterday. She also has pain in her lower back. She had a sore on her left lower leg. Hx of "flesh eating skin disease" in the past.

## 2017-01-26 NOTE — ED Provider Notes (Signed)
[facility] Provider Note   CSN: [id number] Arrival date & time: 01/26/17  1149     History   Chief Complaint Chief Complaint  Patient presents with  . Leg Pain    HPI Sandy Hernandez is a 61 y.o. female.  Patient with several concerns. She feels in general 6. She developed a cellulitis to her left lower leg anteriorly where she's had trouble with infections before. She had a wound that occurred to that when she hit it with a heel of her other foot. Patient is felt as if she has fevers is felt fatigued as she's felt generalized weakness. She's had also a flare of her arthritis. She does have severe arthritis. She is on immunocompromising medications which include Enbrel as well as steroids. Patient recently increased her steroids to 20 mg once a day. Patient started taking clindamycin that she had left over at home for the cellulitis. The redness has improved. Symptoms started on Wednesday when she felt as if she had a fever. Associated with some nausea no fever there has been some urinary frequency. The patient states tetanus is up-to-date.      Past Medical History:  Diagnosis Date  . Anxiety    stressers-caring for spouse who has Alzheimers  . Arthritis    Rheumatoid arthritis- Dr. Nelva Bush, [facility]  . Blood pressure abnormally low    this is normal  . Chronic pain    due to rheumatoid arthritis  . Depression   . Fasciitis   . GERD (gastroesophageal reflux disease)   . Hypothyroidism   . 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 Skin cancer   . Thyroid disease   . Transfusion history     There are no active problems to display for this patient.   Past Surgical History:  Procedure Laterality Date  . BACK SURGERY     lower lumbar fusion 2014  . COLONOSCOPY WITH PROPOFOL N/A 03/21/2016   Procedure: COLONOSCOPY WITH PROPOFOL;  Surgeon: Juanita Craver, MD;  Location: [facility] ENDOSCOPY;  Service: Endoscopy;   Laterality: N/A;  . DILATION AND CURETTAGE OF UTERUS    . HERNIA REPAIR     umbilical hernia repair  . TOTAL KNEE ARTHROPLASTY Right     OB History    No data available       Home Medications    Prior to Admission medications   Medication Sig Start Date End Date Taking? Authorizing Provider  amitriptyline (ELAVIL) 10 MG tablet Take 10 mg by mouth at bedtime.   Yes [provider]  clindamycin (CLEOCIN) 300 MG capsule Take 300 mg by mouth 4 (four) times daily.   Yes [provider]  DULoxetine (CYMBALTA) 30 MG capsule Take 30 mg by mouth daily.   Yes [provider]  Etanercept (ENBREL [place]) Inject into the skin.   Yes [provider]  FLUoxetine (PROZAC) 20 MG capsule Take 20 mg by mouth daily.   Yes [provider]  HYDROmorphone (DILAUDID) 4 MG tablet Take by mouth every 4 (four) hours as needed for severe pain.   Yes [provider]  LIOTHYRONINE SODIUM PO Take by mouth.   Yes [provider]  LORazepam (ATIVAN) 1 MG tablet Take 1 mg by mouth as needed for anxiety.   Yes [provider]  omeprazole (PRILOSEC) 20 MG capsule Take 1 capsule (20 mg total) by mouth daily. 01/24/16  Yes Veryl Speak, MD  oxycodone (ROXICODONE) 30 MG  immediate release tablet Take 80 mg by mouth every 4 (four) hours as needed for pain.   Yes [provider]  predniSONE (DELTASONE) 10 MG tablet Take 10 mg by mouth daily with breakfast.   Yes [provider]  topiramate (TOPAMAX) 100 MG tablet Take 100 mg by mouth 2 (two) times daily.   Yes [provider]  ciprofloxacin (CIPRO) 500 MG tablet Take 1 tablet (500 mg total) by mouth 3 (three) times daily. 01/24/16   Veryl Speak, MD  clindamycin (CLEOCIN) 150 MG capsule Take 3 capsules (450 mg total) by mouth 3 (three) times daily. 01/26/17 02/02/17  Fredia Sorrow, MD  metroNIDAZOLE (FLAGYL) 500 MG tablet Take 1 tablet (500 mg total) by mouth 2 (two) times daily. One  po bid x 7 days 01/24/16   Veryl Speak, MD    Family History No family history on file.  Social History Social History  Substance Use Topics  . Smoking status: Never Smoker  . Smokeless tobacco: Never Used  . Alcohol use No     Allergies   Penicillins   Review of Systems Review of Systems  Constitutional: Positive for fatigue and fever.  HENT: Negative for congestion.   Eyes: Negative for redness.  Respiratory: Negative for shortness of breath.   Cardiovascular: Negative for chest pain.  Gastrointestinal: Negative for abdominal pain.  Genitourinary: Positive for dysuria and frequency.  Musculoskeletal: Positive for arthralgias and myalgias.  Skin: Positive for wound.  Allergic/Immunologic: Positive for immunocompromised state.  Neurological: Negative for headaches.  Hematological: Does not bruise/bleed easily.  Psychiatric/Behavioral: Negative for confusion.     Physical Exam Updated Vital Signs BP 128/80 (BP Location: Right Arm)   Pulse 92   Temp 98.8 F (37.1 C) (Oral)   Resp 19   Ht 1.676 m (5\' 6" )   Wt 111.1 kg (245 lb)   SpO2 99%   BMI 39.54 kg/m   Physical Exam  Constitutional: She is oriented to person, place, and time. She appears well-developed and well-nourished. No distress.  HENT:  Head: Normocephalic and atraumatic.  Mouth/Throat: Oropharynx is clear and moist.  Eyes: Pupils are equal, round, and reactive to light. Conjunctivae and EOM are normal.  Neck: Normal range of motion. Neck supple.  Cardiovascular: Normal rate, regular rhythm and normal heart sounds.   Pulmonary/Chest: Effort normal and breath sounds normal. No respiratory distress.  Abdominal: Soft. Bowel sounds are normal. There is no tenderness.  Musculoskeletal: Normal range of motion. She exhibits edema and tenderness.  Left anterior shin with a bout 5 mm shallow ulcer with scab. Slight erythema surrounding it. No fluctuance no induration no crepitance. Tender to palpation. No  significant increased warmth. No purulent discharge.  Arthritic changes to hands.  Neurological: She is alert and oriented to person, place, and time. No cranial nerve deficit or sensory deficit. She exhibits normal muscle tone. Coordination normal.  Skin: Skin is warm. There is erythema.  Nursing note and vitals reviewed.    ED Treatments / Results  Labs (all labs ordered are listed, but only abnormal results are displayed) Labs Reviewed  URINALYSIS, ROUTINE W REFLEX MICROSCOPIC - Abnormal; Notable for the following:       Result Value   APPearance CLOUDY (*)    Leukocytes, UA TRACE (*)    All other components within normal limits  URINALYSIS, MICROSCOPIC (REFLEX) - Abnormal; Notable for the following:    Bacteria, UA RARE (*)    Squamous Epithelial / LPF 6-30 (*)  All other components within normal limits  CBC WITH DIFFERENTIAL/PLATELET - Abnormal; Notable for the following:    WBC 11.4 (*)    RBC 3.83 (*)    Hemoglobin 11.2 (*)    HCT 34.5 (*)    Neutro Abs 9.2 (*)    All other components within normal limits  COMPREHENSIVE METABOLIC PANEL - Abnormal; Notable for the following:    Glucose, Bld 118 (*)    Calcium 8.3 (*)    All other components within normal limits  CULTURE, BLOOD (ROUTINE X 2)  CULTURE, BLOOD (ROUTINE X 2)  I-STAT CG4 LACTIC ACID, ED    EKG  EKG Interpretation None       Radiology Dg Chest 2 View  Result Date: 01/26/2017 CLINICAL DATA:  Shortness of breath and fever. EXAM: CHEST  2 VIEW COMPARISON:  None. FINDINGS: The heart size and mediastinal contours are within normal limits. Both lungs are clear. The visualized skeletal structures are unremarkable. The right hemidiaphragm is elevated IMPRESSION: No active cardiopulmonary disease. Elevation of the right hemidiaphragm. Electronically Signed   By: Ulyses Jarred M.D.   On: 01/26/2017 13:49   Dg Tibia/fibula Left  Result Date: 01/26/2017 CLINICAL DATA:  Fever and leg wound. EXAM: LEFT TIBIA  AND FIBULA - 2 VIEW COMPARISON:  None. FINDINGS: Nonspecific soft tissue swelling. No soft tissue gas or opaque foreign body. Dystrophic and venous type calcifications seen in the shin primarily. Negative for bone erosion. Advanced knee osteoarthritis with generalized bulky marginal spurring. IMPRESSION: 1. Soft tissue swelling without acute osseous finding or soft tissue gas. 2. Dystrophic calcifications in the skin. 3. Tricompartmental osteoarthritis. Electronically Signed   By: Monte Fantasia M.D.   On: 01/26/2017 13:49    Procedures Procedures (including critical care time)  Medications Ordered in ED Medications  0.9 %  sodium chloride infusion ( Intravenous New Bag/Given 01/26/17 1428)     Initial Impression / Assessment and Plan / ED Course  I have reviewed the triage vital signs and the nursing notes.  Pertinent labs & imaging results that were available during my care of the patient were reviewed by me and considered in my medical decision making (see chart for details).     Patient on immunocompromising medications to include prednisone as well as Enbrel, blood cultures were done lactic acid is normal. Slight elevation in white blood cell count. No evidence of urinary tract infection chest x-ray negative for any acute process. Patient's vital signs are normal no fever here not tachycardic not hypotensive.  Clearly patient has a degree of cellulitis to the left anterior shin. Not sure if this explains all of her symptoms. Blood cultures completed and are pending. Will continue clindamycin with a new prescription. Patient given precautions to return for any new or worse symptoms.  Currently patient nontoxic no acute distress.  Final Clinical Impressions(s) / ED Diagnoses   Final diagnoses:  Cellulitis of left lower extremity    New Prescriptions New Prescriptions   CLINDAMYCIN (CLEOCIN) 150 MG CAPSULE    Take 3 capsules (450 mg total) by mouth 3 (three) times daily.       Fredia Sorrow, MD 01/26/17 [phone]

## 2017-01-26 NOTE — ED Notes (Signed)
Up to [facility].

## 2017-01-31 DIAGNOSIS — M79605 Pain in left leg: Secondary | ICD-10-CM | POA: Diagnosis not present

## 2017-01-31 DIAGNOSIS — Z86718 Personal history of other venous thrombosis and embolism: Secondary | ICD-10-CM | POA: Diagnosis not present

## 2017-01-31 LAB — CULTURE, BLOOD (ROUTINE X 2)
CULTURE: NO GROWTH
Culture: NO GROWTH
SPECIAL REQUESTS: ADEQUATE
Special Requests: ADEQUATE

## 2017-03-06 DIAGNOSIS — M2550 Pain in unspecified joint: Secondary | ICD-10-CM | POA: Diagnosis not present

## 2017-03-06 DIAGNOSIS — M069 Rheumatoid arthritis, unspecified: Secondary | ICD-10-CM | POA: Diagnosis not present

## 2017-03-06 DIAGNOSIS — M545 Low back pain: Secondary | ICD-10-CM | POA: Diagnosis not present

## 2017-03-06 DIAGNOSIS — G8929 Other chronic pain: Secondary | ICD-10-CM | POA: Diagnosis not present

## 2017-03-08 DIAGNOSIS — M79605 Pain in left leg: Secondary | ICD-10-CM | POA: Diagnosis not present

## 2017-03-08 DIAGNOSIS — R600 Localized edema: Secondary | ICD-10-CM | POA: Diagnosis not present

## 2017-03-08 DIAGNOSIS — M545 Low back pain: Secondary | ICD-10-CM | POA: Diagnosis not present

## 2017-03-08 DIAGNOSIS — M7989 Other specified soft tissue disorders: Secondary | ICD-10-CM | POA: Diagnosis not present

## 2017-03-23 DIAGNOSIS — M5417 Radiculopathy, lumbosacral region: Secondary | ICD-10-CM | POA: Diagnosis not present

## 2017-04-03 ENCOUNTER — Other Ambulatory Visit (HOSPITAL_COMMUNITY): Payer: Self-pay | Admitting: Adult Health Nurse Practitioner

## 2017-04-03 DIAGNOSIS — M545 Low back pain: Principal | ICD-10-CM

## 2017-04-03 DIAGNOSIS — G8929 Other chronic pain: Secondary | ICD-10-CM

## 2017-04-06 DIAGNOSIS — I872 Venous insufficiency (chronic) (peripheral): Secondary | ICD-10-CM | POA: Diagnosis not present

## 2017-04-06 DIAGNOSIS — M7989 Other specified soft tissue disorders: Secondary | ICD-10-CM | POA: Diagnosis not present

## 2017-04-14 ENCOUNTER — Ambulatory Visit (HOSPITAL_BASED_OUTPATIENT_CLINIC_OR_DEPARTMENT_OTHER): Payer: PPO

## 2017-04-16 ENCOUNTER — Encounter (HOSPITAL_COMMUNITY): Payer: Self-pay | Admitting: Certified Registered"

## 2017-04-16 NOTE — Progress Notes (Signed)
I called Emily at [facility] and left a message requesting a history/ physical that is within 30 days of tomorrow.  The H?P we have from [facility] is dated 03/06/17.  Raquel Sarna said that the procedure will have to be cancelled.  I informed Raquel Sarna that her office needs to call MRI department to cancel study.

## 2017-04-16 NOTE — Progress Notes (Signed)
I spoke with Sandy Hernandez who has talked to Sandy Hernandez and knows that MRI is being cancelled for tomorrow.  Sandy Hernandez vented her frustation, "Why do ya'll wait and call the day before, I had to find someone to stay with my husband and someone else to drive me there". I explained to patient that we make calls all day 5 days a week and sometimes we can talk to a patient earlier, but the majority of the time the calls are made the day prior.  I apologized to the patient for the inconvenience and the stress that she has suffered today and I wished her the best

## 2017-04-16 NOTE — Anesthesia Preprocedure Evaluation (Deleted)
Anesthesia Evaluation  Patient identified by MRN, date of birth, ID band Patient awake    Reviewed: Allergy & Precautions, H&P , Patient's Chart, lab work & pertinent test results, reviewed documented beta blocker date and time   Airway Mallampati: II  TM Distance: >3 FB Neck ROM: full    Dental no notable dental hx.    Pulmonary    Pulmonary exam normal breath sounds clear to auscultation       Cardiovascular  Rhythm:regular Rate:Normal     Neuro/Psych    GI/Hepatic   Endo/Other    Renal/GU      Musculoskeletal   Abdominal   Peds  Hematology   Anesthesia Other Findings   Reproductive/Obstetrics                             Anesthesia Physical Anesthesia Plan  ASA: II  Anesthesia Plan: General   Post-op Pain Management:    Induction: Intravenous  PONV Risk Score and Plan: 2 and Ondansetron, Dexamethasone and Treatment may vary due to age or medical condition  Airway Management Planned: Oral ETT  Additional Equipment:   Intra-op Plan:   Post-operative Plan: Extubation in OR  Informed Consent: I have reviewed the patients History and Physical, chart, labs and discussed the procedure including the risks, benefits and alternatives for the proposed anesthesia with the patient or authorized representative who has indicated his/her understanding and acceptance.   Dental Advisory Given  Plan Discussed with: CRNA and Surgeon  Anesthesia Plan Comments: (  )        Anesthesia Quick Evaluation

## 2017-04-17 ENCOUNTER — Ambulatory Visit (HOSPITAL_COMMUNITY): Admission: RE | Admit: 2017-04-17 | Payer: PPO | Source: Ambulatory Visit

## 2017-04-17 SURGERY — MRI WITH ANESTHESIA
Anesthesia: General | Laterality: Bilateral

## 2017-05-01 DIAGNOSIS — Z5181 Encounter for therapeutic drug level monitoring: Secondary | ICD-10-CM | POA: Diagnosis not present

## 2017-05-01 DIAGNOSIS — M545 Low back pain: Secondary | ICD-10-CM | POA: Diagnosis not present

## 2017-05-01 DIAGNOSIS — M069 Rheumatoid arthritis, unspecified: Secondary | ICD-10-CM | POA: Diagnosis not present

## 2017-05-01 DIAGNOSIS — M2550 Pain in unspecified joint: Secondary | ICD-10-CM | POA: Diagnosis not present

## 2017-05-01 DIAGNOSIS — Z79899 Other long term (current) drug therapy: Secondary | ICD-10-CM | POA: Diagnosis not present

## 2017-05-01 DIAGNOSIS — M5417 Radiculopathy, lumbosacral region: Secondary | ICD-10-CM | POA: Diagnosis not present

## 2017-05-07 DIAGNOSIS — R590 Localized enlarged lymph nodes: Secondary | ICD-10-CM | POA: Diagnosis not present

## 2017-05-07 DIAGNOSIS — Z23 Encounter for immunization: Secondary | ICD-10-CM | POA: Diagnosis not present

## 2017-06-07 DIAGNOSIS — M5135 Other intervertebral disc degeneration, thoracolumbar region: Secondary | ICD-10-CM | POA: Diagnosis not present

## 2017-06-07 DIAGNOSIS — M5136 Other intervertebral disc degeneration, lumbar region: Secondary | ICD-10-CM | POA: Diagnosis not present

## 2017-06-07 DIAGNOSIS — M48061 Spinal stenosis, lumbar region without neurogenic claudication: Secondary | ICD-10-CM | POA: Diagnosis not present

## 2017-06-07 DIAGNOSIS — M47896 Other spondylosis, lumbar region: Secondary | ICD-10-CM | POA: Diagnosis not present

## 2017-06-07 DIAGNOSIS — M4316 Spondylolisthesis, lumbar region: Secondary | ICD-10-CM | POA: Diagnosis not present

## 2017-06-07 DIAGNOSIS — M545 Low back pain: Secondary | ICD-10-CM | POA: Diagnosis not present

## 2017-06-14 DIAGNOSIS — Z1159 Encounter for screening for other viral diseases: Secondary | ICD-10-CM | POA: Diagnosis not present

## 2017-06-14 DIAGNOSIS — M069 Rheumatoid arthritis, unspecified: Secondary | ICD-10-CM | POA: Diagnosis not present

## 2017-06-14 DIAGNOSIS — R5383 Other fatigue: Secondary | ICD-10-CM | POA: Diagnosis not present

## 2017-06-14 DIAGNOSIS — E039 Hypothyroidism, unspecified: Secondary | ICD-10-CM | POA: Diagnosis not present

## 2017-07-02 DIAGNOSIS — M2550 Pain in unspecified joint: Secondary | ICD-10-CM | POA: Diagnosis not present

## 2017-07-02 DIAGNOSIS — M069 Rheumatoid arthritis, unspecified: Secondary | ICD-10-CM | POA: Diagnosis not present

## 2017-07-02 DIAGNOSIS — G8929 Other chronic pain: Secondary | ICD-10-CM | POA: Diagnosis not present

## 2017-07-02 DIAGNOSIS — M545 Low back pain: Secondary | ICD-10-CM | POA: Diagnosis not present

## 2017-07-10 DIAGNOSIS — R5382 Chronic fatigue, unspecified: Secondary | ICD-10-CM | POA: Diagnosis not present

## 2017-07-10 DIAGNOSIS — R41840 Attention and concentration deficit: Secondary | ICD-10-CM | POA: Diagnosis not present

## 2017-07-10 DIAGNOSIS — E038 Other specified hypothyroidism: Secondary | ICD-10-CM | POA: Diagnosis not present

## 2017-07-10 DIAGNOSIS — R635 Abnormal weight gain: Secondary | ICD-10-CM | POA: Diagnosis not present

## 2017-07-25 DIAGNOSIS — H00012 Hordeolum externum right lower eyelid: Secondary | ICD-10-CM | POA: Diagnosis not present

## 2017-07-25 DIAGNOSIS — L97921 Non-pressure chronic ulcer of unspecified part of left lower leg limited to breakdown of skin: Secondary | ICD-10-CM | POA: Diagnosis not present

## 2017-08-01 DIAGNOSIS — M5417 Radiculopathy, lumbosacral region: Secondary | ICD-10-CM | POA: Diagnosis not present

## 2017-08-06 DIAGNOSIS — Z7952 Long term (current) use of systemic steroids: Secondary | ICD-10-CM | POA: Diagnosis not present

## 2017-08-06 DIAGNOSIS — R7301 Impaired fasting glucose: Secondary | ICD-10-CM | POA: Diagnosis not present

## 2017-08-06 DIAGNOSIS — Z1231 Encounter for screening mammogram for malignant neoplasm of breast: Secondary | ICD-10-CM | POA: Diagnosis not present

## 2017-08-06 DIAGNOSIS — M069 Rheumatoid arthritis, unspecified: Secondary | ICD-10-CM | POA: Diagnosis not present

## 2017-08-06 DIAGNOSIS — Z0000 Encounter for general adult medical examination without abnormal findings: Secondary | ICD-10-CM | POA: Diagnosis not present

## 2017-08-07 DIAGNOSIS — E038 Other specified hypothyroidism: Secondary | ICD-10-CM | POA: Diagnosis not present

## 2017-08-07 DIAGNOSIS — R5382 Chronic fatigue, unspecified: Secondary | ICD-10-CM | POA: Diagnosis not present

## 2017-08-07 DIAGNOSIS — R7301 Impaired fasting glucose: Secondary | ICD-10-CM | POA: Diagnosis not present

## 2017-08-07 DIAGNOSIS — M549 Dorsalgia, unspecified: Secondary | ICD-10-CM | POA: Diagnosis not present

## 2017-08-07 DIAGNOSIS — M542 Cervicalgia: Secondary | ICD-10-CM | POA: Diagnosis not present

## 2017-08-07 DIAGNOSIS — M069 Rheumatoid arthritis, unspecified: Secondary | ICD-10-CM | POA: Diagnosis not present

## 2017-08-15 DIAGNOSIS — H00022 Hordeolum internum right lower eyelid: Secondary | ICD-10-CM | POA: Diagnosis not present

## 2017-08-16 DIAGNOSIS — G8929 Other chronic pain: Secondary | ICD-10-CM | POA: Diagnosis not present

## 2017-08-16 DIAGNOSIS — M25512 Pain in left shoulder: Secondary | ICD-10-CM | POA: Diagnosis not present

## 2017-08-16 DIAGNOSIS — G5782 Other specified mononeuropathies of left lower limb: Secondary | ICD-10-CM | POA: Diagnosis not present

## 2017-08-27 DIAGNOSIS — M069 Rheumatoid arthritis, unspecified: Secondary | ICD-10-CM | POA: Diagnosis not present

## 2017-08-27 DIAGNOSIS — M545 Low back pain: Secondary | ICD-10-CM | POA: Diagnosis not present

## 2017-08-27 DIAGNOSIS — M2550 Pain in unspecified joint: Secondary | ICD-10-CM | POA: Diagnosis not present

## 2017-08-27 DIAGNOSIS — M5417 Radiculopathy, lumbosacral region: Secondary | ICD-10-CM | POA: Diagnosis not present

## 2017-09-05 DIAGNOSIS — E039 Hypothyroidism, unspecified: Secondary | ICD-10-CM | POA: Diagnosis not present

## 2017-09-05 DIAGNOSIS — R9082 White matter disease, unspecified: Secondary | ICD-10-CM | POA: Diagnosis not present

## 2017-09-05 DIAGNOSIS — E038 Other specified hypothyroidism: Secondary | ICD-10-CM | POA: Diagnosis not present

## 2017-09-06 DIAGNOSIS — H2513 Age-related nuclear cataract, bilateral: Secondary | ICD-10-CM | POA: Diagnosis not present

## 2017-09-06 DIAGNOSIS — H40033 Anatomical narrow angle, bilateral: Secondary | ICD-10-CM | POA: Diagnosis not present

## 2017-09-06 DIAGNOSIS — H0012 Chalazion right lower eyelid: Secondary | ICD-10-CM | POA: Diagnosis not present

## 2017-09-13 DIAGNOSIS — E039 Hypothyroidism, unspecified: Secondary | ICD-10-CM | POA: Diagnosis not present

## 2017-09-24 DIAGNOSIS — G8929 Other chronic pain: Secondary | ICD-10-CM | POA: Diagnosis not present

## 2017-09-24 DIAGNOSIS — M545 Low back pain: Secondary | ICD-10-CM | POA: Diagnosis not present

## 2017-09-24 DIAGNOSIS — M069 Rheumatoid arthritis, unspecified: Secondary | ICD-10-CM | POA: Diagnosis not present

## 2017-09-24 DIAGNOSIS — M2550 Pain in unspecified joint: Secondary | ICD-10-CM | POA: Diagnosis not present

## 2017-09-26 DIAGNOSIS — Q386 Other congenital malformations of mouth: Secondary | ICD-10-CM | POA: Diagnosis not present

## 2017-09-26 DIAGNOSIS — L718 Other rosacea: Secondary | ICD-10-CM | POA: Diagnosis not present

## 2017-09-26 DIAGNOSIS — L738 Other specified follicular disorders: Secondary | ICD-10-CM | POA: Diagnosis not present

## 2017-10-03 IMAGING — DX DG TIBIA/FIBULA 2V*L*
4 series · 4 of 4 positions shown · non-contrast
Comparison: None.

CLINICAL DATA: Fever and leg wound.

EXAM:
LEFT TIBIA AND FIBULA - 2 VIEW

[tibia ap (1 of 2)]
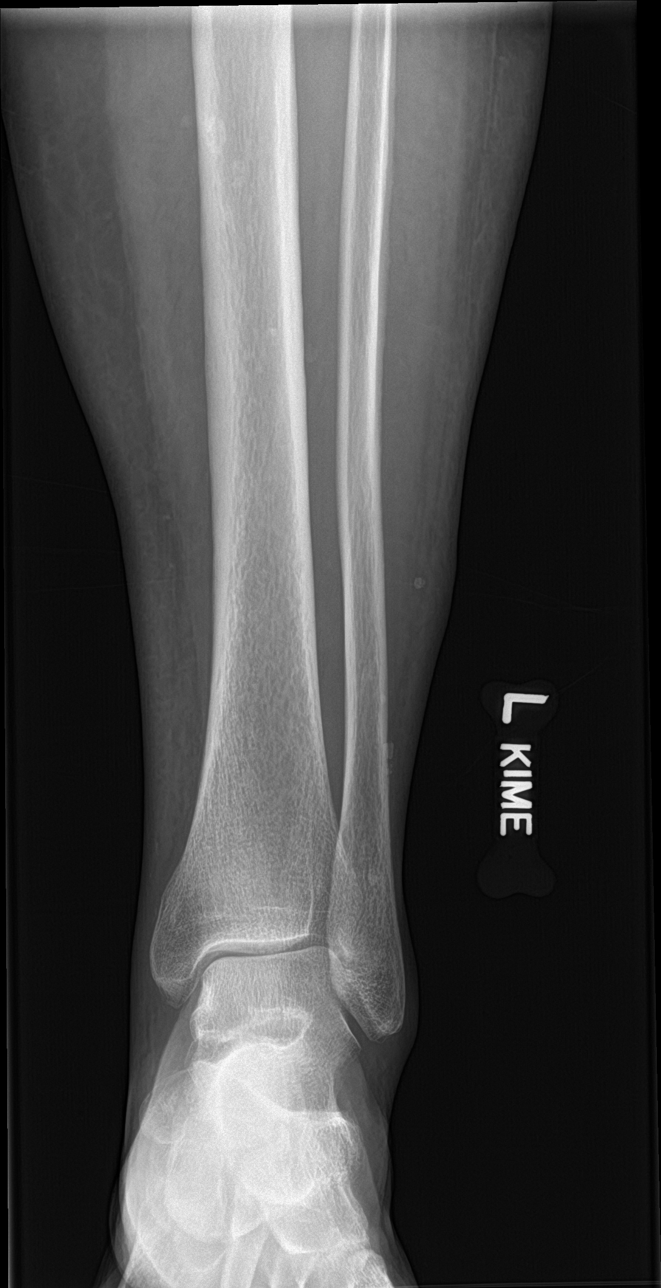

[tibia ap (2 of 2)]
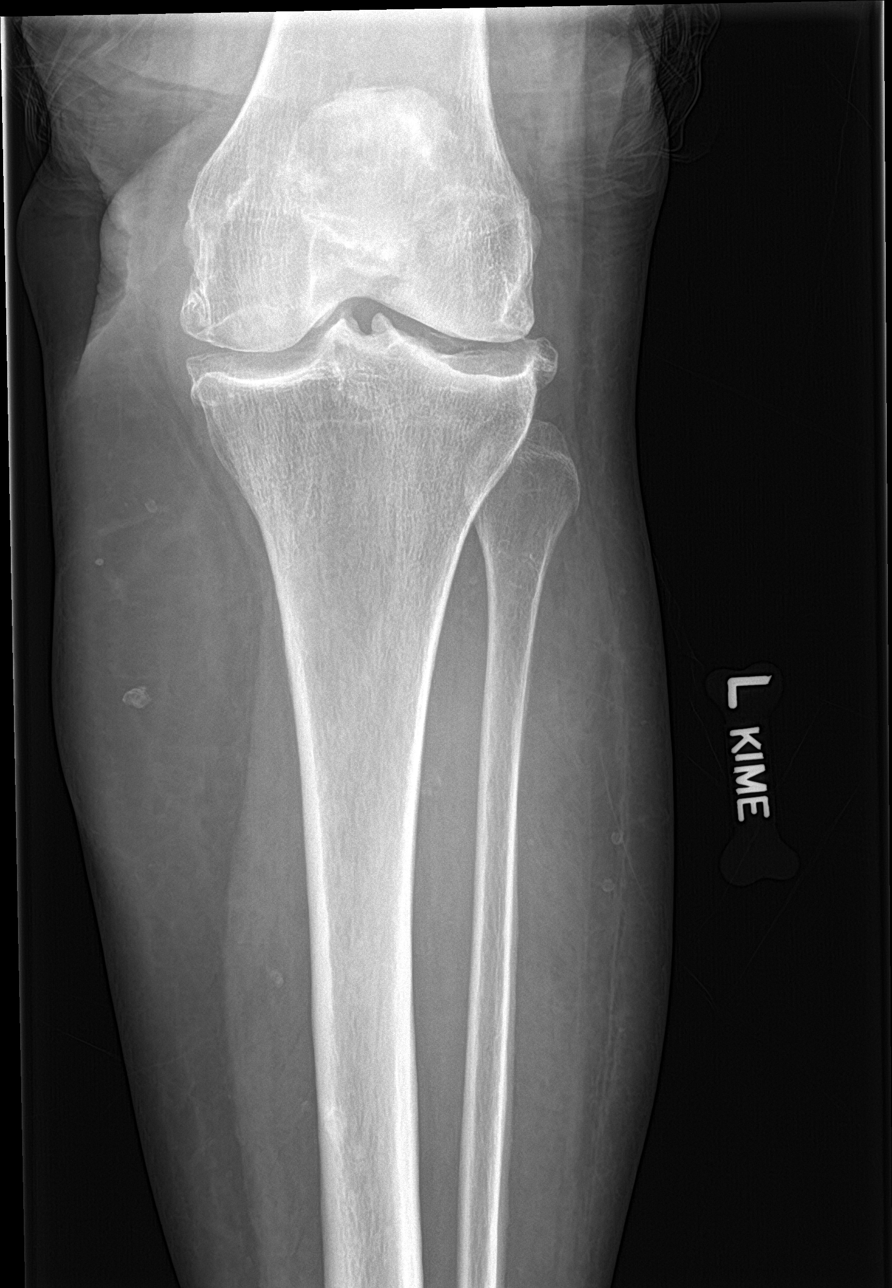

[tibia lat (1 of 2)]
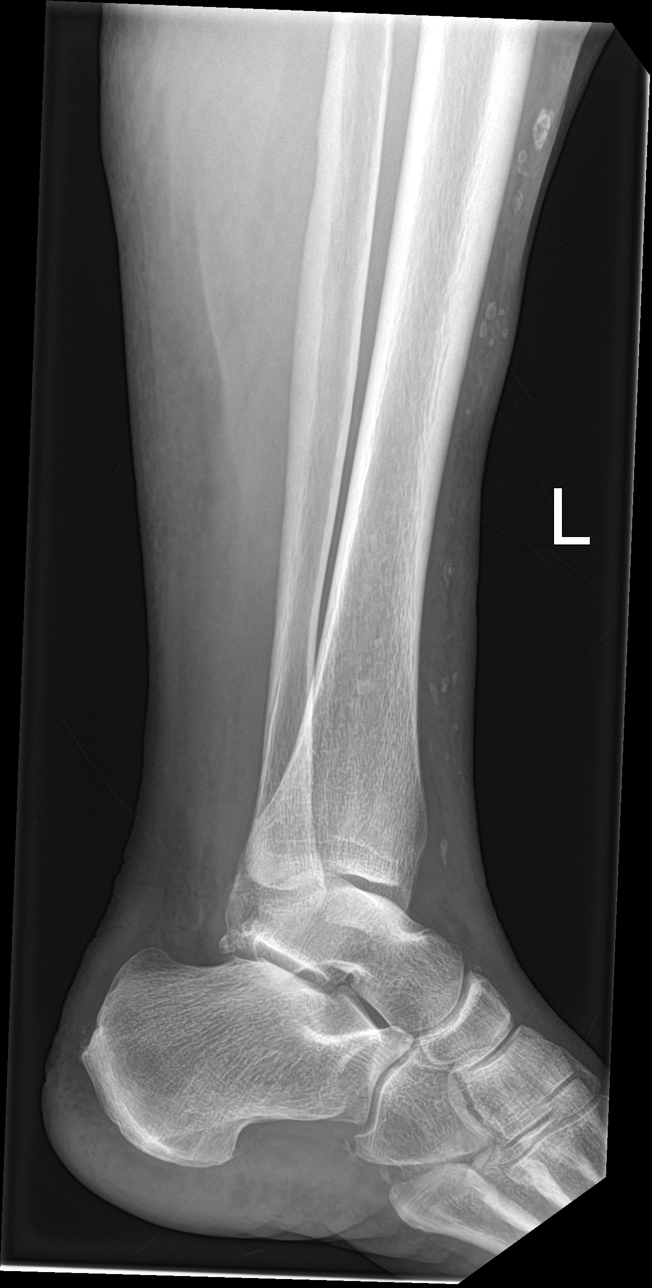

[tibia lat (2 of 2)]
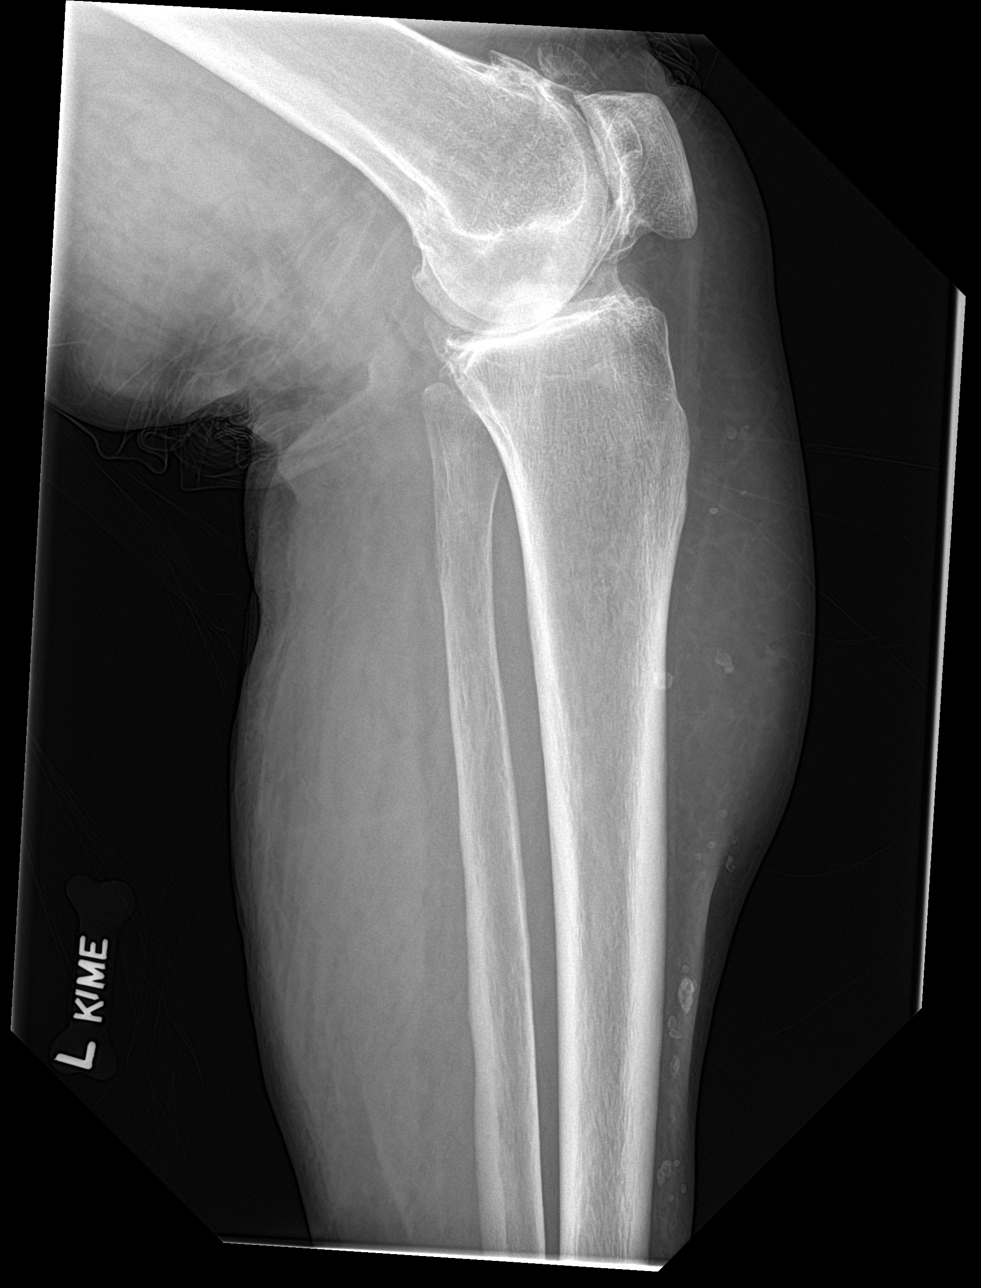

[4 of 4 positions shown; findings below may reference images not displayed]

FINDINGS: Nonspecific soft tissue swelling. No soft tissue gas or opaque
foreign body. Dystrophic and venous type calcifications seen in the
shin primarily. Negative for bone erosion. Advanced knee
osteoarthritis with generalized bulky marginal spurring.
IMPRESSION: 1. Soft tissue swelling without acute osseous finding or soft tissue
gas.
2. Dystrophic calcifications in the skin.
3. Tricompartmental osteoarthritis.

## 2017-10-04 DIAGNOSIS — R5382 Chronic fatigue, unspecified: Secondary | ICD-10-CM | POA: Diagnosis not present

## 2017-10-04 DIAGNOSIS — E038 Other specified hypothyroidism: Secondary | ICD-10-CM | POA: Diagnosis not present

## 2017-10-04 DIAGNOSIS — M069 Rheumatoid arthritis, unspecified: Secondary | ICD-10-CM | POA: Diagnosis not present

## 2017-11-05 DIAGNOSIS — M2550 Pain in unspecified joint: Secondary | ICD-10-CM | POA: Diagnosis not present

## 2017-11-05 DIAGNOSIS — M069 Rheumatoid arthritis, unspecified: Secondary | ICD-10-CM | POA: Diagnosis not present

## 2017-11-05 DIAGNOSIS — M545 Low back pain: Secondary | ICD-10-CM | POA: Diagnosis not present

## 2017-11-05 DIAGNOSIS — M25511 Pain in right shoulder: Secondary | ICD-10-CM | POA: Diagnosis not present

## 2017-12-06 DIAGNOSIS — M069 Rheumatoid arthritis, unspecified: Secondary | ICD-10-CM | POA: Diagnosis not present

## 2017-12-06 DIAGNOSIS — R5382 Chronic fatigue, unspecified: Secondary | ICD-10-CM | POA: Diagnosis not present

## 2017-12-06 DIAGNOSIS — E669 Obesity, unspecified: Secondary | ICD-10-CM | POA: Diagnosis not present

## 2017-12-06 DIAGNOSIS — E038 Other specified hypothyroidism: Secondary | ICD-10-CM | POA: Diagnosis not present

## 2017-12-10 DIAGNOSIS — K219 Gastro-esophageal reflux disease without esophagitis: Secondary | ICD-10-CM | POA: Diagnosis not present

## 2017-12-10 DIAGNOSIS — E039 Hypothyroidism, unspecified: Secondary | ICD-10-CM | POA: Diagnosis not present

## 2017-12-10 DIAGNOSIS — Z6841 Body Mass Index (BMI) 40.0 and over, adult: Secondary | ICD-10-CM | POA: Diagnosis not present

## 2017-12-10 DIAGNOSIS — I10 Essential (primary) hypertension: Secondary | ICD-10-CM | POA: Diagnosis not present

## 2017-12-24 DIAGNOSIS — M25511 Pain in right shoulder: Secondary | ICD-10-CM | POA: Diagnosis not present

## 2018-01-14 DIAGNOSIS — Z79899 Other long term (current) drug therapy: Secondary | ICD-10-CM | POA: Diagnosis not present

## 2018-01-14 DIAGNOSIS — M545 Low back pain: Secondary | ICD-10-CM | POA: Diagnosis not present

## 2018-01-14 DIAGNOSIS — M2550 Pain in unspecified joint: Secondary | ICD-10-CM | POA: Diagnosis not present

## 2018-01-14 DIAGNOSIS — M069 Rheumatoid arthritis, unspecified: Secondary | ICD-10-CM | POA: Diagnosis not present

## 2018-01-14 DIAGNOSIS — M25511 Pain in right shoulder: Secondary | ICD-10-CM | POA: Diagnosis not present

## 2018-01-14 DIAGNOSIS — Z5181 Encounter for therapeutic drug level monitoring: Secondary | ICD-10-CM | POA: Diagnosis not present

## 2018-01-23 DIAGNOSIS — J0100 Acute maxillary sinusitis, unspecified: Secondary | ICD-10-CM | POA: Diagnosis not present

## 2018-01-23 DIAGNOSIS — M069 Rheumatoid arthritis, unspecified: Secondary | ICD-10-CM | POA: Diagnosis not present

## 2018-03-12 DIAGNOSIS — M2550 Pain in unspecified joint: Secondary | ICD-10-CM | POA: Diagnosis not present

## 2018-03-12 DIAGNOSIS — M069 Rheumatoid arthritis, unspecified: Secondary | ICD-10-CM | POA: Diagnosis not present

## 2018-03-12 DIAGNOSIS — M25511 Pain in right shoulder: Secondary | ICD-10-CM | POA: Diagnosis not present

## 2018-03-12 DIAGNOSIS — M545 Low back pain: Secondary | ICD-10-CM | POA: Diagnosis not present

## 2018-03-14 DIAGNOSIS — Z91048 Other nonmedicinal substance allergy status: Secondary | ICD-10-CM | POA: Diagnosis not present

## 2018-03-14 DIAGNOSIS — G5681 Other specified mononeuropathies of right upper limb: Secondary | ICD-10-CM | POA: Diagnosis not present

## 2018-03-14 DIAGNOSIS — M797 Fibromyalgia: Secondary | ICD-10-CM | POA: Diagnosis not present

## 2018-03-14 DIAGNOSIS — Z880 Allergy status to penicillin: Secondary | ICD-10-CM | POA: Diagnosis not present

## 2018-03-14 DIAGNOSIS — Z96651 Presence of right artificial knee joint: Secondary | ICD-10-CM | POA: Diagnosis not present

## 2018-03-14 DIAGNOSIS — M25511 Pain in right shoulder: Secondary | ICD-10-CM | POA: Diagnosis not present

## 2018-03-14 DIAGNOSIS — E039 Hypothyroidism, unspecified: Secondary | ICD-10-CM | POA: Diagnosis not present

## 2018-03-14 DIAGNOSIS — M069 Rheumatoid arthritis, unspecified: Secondary | ICD-10-CM | POA: Diagnosis not present

## 2018-03-14 DIAGNOSIS — K219 Gastro-esophageal reflux disease without esophagitis: Secondary | ICD-10-CM | POA: Diagnosis not present

## 2018-03-14 DIAGNOSIS — Z87891 Personal history of nicotine dependence: Secondary | ICD-10-CM | POA: Diagnosis not present

## 2018-03-14 DIAGNOSIS — G8929 Other chronic pain: Secondary | ICD-10-CM | POA: Diagnosis not present

## 2018-04-04 DIAGNOSIS — R079 Chest pain, unspecified: Secondary | ICD-10-CM | POA: Diagnosis not present

## 2018-04-04 DIAGNOSIS — R002 Palpitations: Secondary | ICD-10-CM | POA: Diagnosis not present

## 2018-04-04 DIAGNOSIS — E669 Obesity, unspecified: Secondary | ICD-10-CM | POA: Diagnosis not present

## 2018-04-04 DIAGNOSIS — R0602 Shortness of breath: Secondary | ICD-10-CM | POA: Diagnosis not present

## 2018-04-09 DIAGNOSIS — Z5181 Encounter for therapeutic drug level monitoring: Secondary | ICD-10-CM | POA: Diagnosis not present

## 2018-04-09 DIAGNOSIS — R5382 Chronic fatigue, unspecified: Secondary | ICD-10-CM | POA: Diagnosis not present

## 2018-04-09 DIAGNOSIS — E038 Other specified hypothyroidism: Secondary | ICD-10-CM | POA: Diagnosis not present

## 2018-04-09 DIAGNOSIS — M069 Rheumatoid arthritis, unspecified: Secondary | ICD-10-CM | POA: Diagnosis not present

## 2018-04-09 DIAGNOSIS — I081 Rheumatic disorders of both mitral and tricuspid valves: Secondary | ICD-10-CM | POA: Diagnosis not present

## 2018-04-09 DIAGNOSIS — I517 Cardiomegaly: Secondary | ICD-10-CM | POA: Diagnosis not present

## 2018-04-09 DIAGNOSIS — E669 Obesity, unspecified: Secondary | ICD-10-CM | POA: Diagnosis not present

## 2018-04-23 DIAGNOSIS — R079 Chest pain, unspecified: Secondary | ICD-10-CM | POA: Diagnosis not present

## 2018-04-23 DIAGNOSIS — R0602 Shortness of breath: Secondary | ICD-10-CM | POA: Diagnosis not present

## 2018-05-07 DIAGNOSIS — M069 Rheumatoid arthritis, unspecified: Secondary | ICD-10-CM | POA: Diagnosis not present

## 2018-05-07 DIAGNOSIS — M2550 Pain in unspecified joint: Secondary | ICD-10-CM | POA: Diagnosis not present

## 2018-05-07 DIAGNOSIS — M545 Low back pain: Secondary | ICD-10-CM | POA: Diagnosis not present

## 2018-05-07 DIAGNOSIS — M25511 Pain in right shoulder: Secondary | ICD-10-CM | POA: Diagnosis not present

## 2018-05-29 DIAGNOSIS — Z23 Encounter for immunization: Secondary | ICD-10-CM | POA: Diagnosis not present

## 2018-06-10 DIAGNOSIS — G5682 Other specified mononeuropathies of left upper limb: Secondary | ICD-10-CM | POA: Diagnosis not present

## 2018-06-27 DIAGNOSIS — E038 Other specified hypothyroidism: Secondary | ICD-10-CM | POA: Diagnosis not present

## 2018-06-27 DIAGNOSIS — Z5181 Encounter for therapeutic drug level monitoring: Secondary | ICD-10-CM | POA: Diagnosis not present

## 2018-07-03 DIAGNOSIS — G894 Chronic pain syndrome: Secondary | ICD-10-CM | POA: Diagnosis not present

## 2018-07-03 DIAGNOSIS — M069 Rheumatoid arthritis, unspecified: Secondary | ICD-10-CM | POA: Diagnosis not present

## 2018-07-03 DIAGNOSIS — M545 Low back pain: Secondary | ICD-10-CM | POA: Diagnosis not present

## 2018-07-03 DIAGNOSIS — Z5181 Encounter for therapeutic drug level monitoring: Secondary | ICD-10-CM | POA: Diagnosis not present

## 2018-07-03 DIAGNOSIS — M25511 Pain in right shoulder: Secondary | ICD-10-CM | POA: Diagnosis not present

## 2018-07-03 DIAGNOSIS — Z79899 Other long term (current) drug therapy: Secondary | ICD-10-CM | POA: Diagnosis not present

## 2018-07-11 DIAGNOSIS — E669 Obesity, unspecified: Secondary | ICD-10-CM | POA: Diagnosis not present

## 2018-07-11 DIAGNOSIS — Z5181 Encounter for therapeutic drug level monitoring: Secondary | ICD-10-CM | POA: Diagnosis not present

## 2018-07-11 DIAGNOSIS — E038 Other specified hypothyroidism: Secondary | ICD-10-CM | POA: Diagnosis not present

## 2018-07-11 DIAGNOSIS — M069 Rheumatoid arthritis, unspecified: Secondary | ICD-10-CM | POA: Diagnosis not present

## 2018-07-21 DIAGNOSIS — R21 Rash and other nonspecific skin eruption: Secondary | ICD-10-CM | POA: Diagnosis not present

## 2018-07-21 DIAGNOSIS — R509 Fever, unspecified: Secondary | ICD-10-CM | POA: Diagnosis not present

## 2018-07-21 DIAGNOSIS — L03115 Cellulitis of right lower limb: Secondary | ICD-10-CM | POA: Diagnosis not present

## 2018-07-21 DIAGNOSIS — R42 Dizziness and giddiness: Secondary | ICD-10-CM | POA: Diagnosis not present

## 2018-07-21 DIAGNOSIS — R011 Cardiac murmur, unspecified: Secondary | ICD-10-CM | POA: Diagnosis not present

## 2018-07-21 DIAGNOSIS — I471 Supraventricular tachycardia: Secondary | ICD-10-CM | POA: Diagnosis not present

## 2018-07-21 DIAGNOSIS — I498 Other specified cardiac arrhythmias: Secondary | ICD-10-CM | POA: Diagnosis not present

## 2018-07-21 DIAGNOSIS — R002 Palpitations: Secondary | ICD-10-CM | POA: Diagnosis not present

## 2018-07-21 DIAGNOSIS — R000 Tachycardia, unspecified: Secondary | ICD-10-CM | POA: Diagnosis not present

## 2018-07-22 DIAGNOSIS — B029 Zoster without complications: Secondary | ICD-10-CM | POA: Diagnosis not present

## 2018-07-22 DIAGNOSIS — L304 Erythema intertrigo: Secondary | ICD-10-CM | POA: Diagnosis not present

## 2018-07-22 DIAGNOSIS — R000 Tachycardia, unspecified: Secondary | ICD-10-CM | POA: Diagnosis not present

## 2018-07-22 DIAGNOSIS — I872 Venous insufficiency (chronic) (peripheral): Secondary | ICD-10-CM | POA: Diagnosis not present

## 2018-07-22 DIAGNOSIS — I87391 Chronic venous hypertension (idiopathic) with other complications of right lower extremity: Secondary | ICD-10-CM | POA: Diagnosis not present

## 2018-07-23 DIAGNOSIS — R509 Fever, unspecified: Secondary | ICD-10-CM | POA: Diagnosis not present

## 2018-07-23 DIAGNOSIS — I471 Supraventricular tachycardia: Secondary | ICD-10-CM | POA: Diagnosis not present

## 2018-07-23 DIAGNOSIS — R002 Palpitations: Secondary | ICD-10-CM | POA: Diagnosis not present

## 2018-07-26 ENCOUNTER — Other Ambulatory Visit: Payer: Self-pay

## 2018-07-26 NOTE — Patient Outreach (Signed)
[facility]) Care Management  07/26/2018  Sandy Hernandez 27-Apr-1956 [id number]   Referral Date: 07/26/2018 Referral Source: [facility] Referral Referral Reason: needs medication assistance with Armour Thyroid and Enbrel per referral.   Outreach Attempt: no answer.  HIPAA compliant voice message left.     Plan: RN CM will attempt again within 4 business days and send letter.   [name], RN, MSN [facility] Care Management Care Management Coordinator Direct Line [phone] Toll Free: [phone]  Fax: [phone]

## 2018-07-29 ENCOUNTER — Other Ambulatory Visit: Payer: Self-pay

## 2018-07-29 DIAGNOSIS — Z6841 Body Mass Index (BMI) 40.0 and over, adult: Secondary | ICD-10-CM | POA: Diagnosis not present

## 2018-07-29 DIAGNOSIS — R0602 Shortness of breath: Secondary | ICD-10-CM | POA: Diagnosis not present

## 2018-07-29 DIAGNOSIS — R079 Chest pain, unspecified: Secondary | ICD-10-CM | POA: Diagnosis not present

## 2018-07-29 DIAGNOSIS — R002 Palpitations: Secondary | ICD-10-CM | POA: Diagnosis not present

## 2018-07-29 NOTE — Patient Outreach (Addendum)
[facility]) Care Management  07/29/2018  Sandy Hernandez 1956-03-05 [id number]   Referral Date: 07/26/2018 Referral Source: [facility] Referral Referral Reason: needs medication assistance with Armour Thyroid and Enbrel per referral.   Outreach Attempt: no answer.  Unable to leave a message.     Plan: RN CM will attempt again within 4 business days.  [name], RN, MSN [place] Management Care Management Coordinator Direct Line [phone] Cell [phone] Toll Free: [phone]  Fax: [phone]

## 2018-07-30 ENCOUNTER — Other Ambulatory Visit: Payer: Self-pay

## 2018-07-30 NOTE — Patient Outreach (Signed)
[place] [facility]) Care Management  07/30/2018  Dejanee Thibeaux 1955/08/03 [id number]   Referral Date: 07/26/2018 Referral Source: [facility] Referral Referral Reason: needs medication assistance with Armour Thyroid and Enbrel per referral.   Outreach Attempt: no answer.  HIPAA compliant voice message left.     Plan: RN CM will wait return call.  If no return call will close case.  [name], RN, MSN [place] Management Care Management Coordinator Direct Line [phone] Cell [phone] Toll Free: [phone]  Fax: [phone]

## 2018-08-02 ENCOUNTER — Other Ambulatory Visit: Payer: Self-pay

## 2018-08-02 NOTE — Patient Outreach (Addendum)
[facility]) Care Management  08/02/2018  Sandy Hernandez [birth year] [id number]   Referral Date:07/26/2018 Referral Source:[facility] Referral Referral Reason:needs medication assistance with Armour Thyroid and Enbrel per referral.   Outreach Attempt:Return call to patient, no answer. HIPAA compliant voice message left.  Plan: RN CM will wait return call. If no return call will close case.  [name], RN, MSN [place] Management Care Management Coordinator Direct Line [phone] Cell [phone] Toll Free: [phone]  Fax: [phone]

## 2018-08-05 ENCOUNTER — Other Ambulatory Visit: Payer: Self-pay

## 2018-08-05 NOTE — Patient Outreach (Signed)
[facility]) Care Management  08/05/2018  Sandy Hernandez Jun 07, 1956 [id number]   Referral Date:07/26/2018 Referral Source:[facility] Referral Referral Reason:needs medication assistance with Armour Thyroid and Enbrel per referral.  Outreach to patient.  She is able to verify HIPAA.  Discussed reason for the referral.  She states that she knows about [facility] and does not need the services.  She states that she was explaining to the young lady that she cannot afford her Enbrel but the medication is covered by the foundation right now.  However, patient states that if she is not covered any longer she will need to call back.  She also states that her armour thyroid medication is just not covered by [employer] but states she is able to get her medication.  She is appreciative of the follow up but would not dare utilize the services unless she needs it.  She states she has CM contact information for future reference.    Plan: RN CM will close case.    [name], RN, MSN [place] Management Care Management Coordinator Direct Line [phone] Cell [phone] Toll Free: [phone]  Fax: [phone]

## 2018-08-26 DIAGNOSIS — M545 Low back pain: Secondary | ICD-10-CM | POA: Diagnosis not present

## 2018-08-26 DIAGNOSIS — M069 Rheumatoid arthritis, unspecified: Secondary | ICD-10-CM | POA: Diagnosis not present

## 2018-08-26 DIAGNOSIS — M25511 Pain in right shoulder: Secondary | ICD-10-CM | POA: Diagnosis not present

## 2018-08-26 DIAGNOSIS — G894 Chronic pain syndrome: Secondary | ICD-10-CM | POA: Diagnosis not present

## 2018-10-08 DIAGNOSIS — E038 Other specified hypothyroidism: Secondary | ICD-10-CM | POA: Diagnosis not present

## 2018-10-08 DIAGNOSIS — Z5181 Encounter for therapeutic drug level monitoring: Secondary | ICD-10-CM | POA: Diagnosis not present

## 2018-10-08 DIAGNOSIS — M069 Rheumatoid arthritis, unspecified: Secondary | ICD-10-CM | POA: Diagnosis not present

## 2018-10-21 DIAGNOSIS — M2550 Pain in unspecified joint: Secondary | ICD-10-CM | POA: Diagnosis not present

## 2018-10-21 DIAGNOSIS — M545 Low back pain: Secondary | ICD-10-CM | POA: Diagnosis not present

## 2018-10-21 DIAGNOSIS — M069 Rheumatoid arthritis, unspecified: Secondary | ICD-10-CM | POA: Diagnosis not present

## 2018-10-21 DIAGNOSIS — M25511 Pain in right shoulder: Secondary | ICD-10-CM | POA: Diagnosis not present

## 2018-10-29 DIAGNOSIS — F418 Other specified anxiety disorders: Secondary | ICD-10-CM | POA: Diagnosis not present

## 2018-10-29 DIAGNOSIS — H539 Unspecified visual disturbance: Secondary | ICD-10-CM | POA: Diagnosis not present

## 2018-10-29 DIAGNOSIS — D899 Disorder involving the immune mechanism, unspecified: Secondary | ICD-10-CM | POA: Diagnosis not present

## 2018-12-04 DIAGNOSIS — M25511 Pain in right shoulder: Secondary | ICD-10-CM | POA: Diagnosis not present

## 2018-12-04 DIAGNOSIS — M545 Low back pain: Secondary | ICD-10-CM | POA: Diagnosis not present

## 2018-12-04 DIAGNOSIS — M2550 Pain in unspecified joint: Secondary | ICD-10-CM | POA: Diagnosis not present

## 2018-12-04 DIAGNOSIS — M069 Rheumatoid arthritis, unspecified: Secondary | ICD-10-CM | POA: Diagnosis not present

## 2018-12-12 DIAGNOSIS — M069 Rheumatoid arthritis, unspecified: Secondary | ICD-10-CM | POA: Diagnosis not present

## 2018-12-12 DIAGNOSIS — Z5181 Encounter for therapeutic drug level monitoring: Secondary | ICD-10-CM | POA: Diagnosis not present

## 2018-12-12 DIAGNOSIS — E669 Obesity, unspecified: Secondary | ICD-10-CM | POA: Diagnosis not present

## 2018-12-12 DIAGNOSIS — E038 Other specified hypothyroidism: Secondary | ICD-10-CM | POA: Diagnosis not present

## 2019-01-09 DIAGNOSIS — M2550 Pain in unspecified joint: Secondary | ICD-10-CM | POA: Diagnosis not present

## 2019-01-09 DIAGNOSIS — M545 Low back pain: Secondary | ICD-10-CM | POA: Diagnosis not present

## 2019-01-09 DIAGNOSIS — M25511 Pain in right shoulder: Secondary | ICD-10-CM | POA: Diagnosis not present

## 2019-01-09 DIAGNOSIS — M069 Rheumatoid arthritis, unspecified: Secondary | ICD-10-CM | POA: Diagnosis not present

## 2019-01-31 DIAGNOSIS — Z79899 Other long term (current) drug therapy: Secondary | ICD-10-CM | POA: Diagnosis not present

## 2019-01-31 DIAGNOSIS — Z5181 Encounter for therapeutic drug level monitoring: Secondary | ICD-10-CM | POA: Diagnosis not present

## 2019-02-06 DIAGNOSIS — M25511 Pain in right shoulder: Secondary | ICD-10-CM | POA: Diagnosis not present

## 2019-02-06 DIAGNOSIS — M545 Low back pain: Secondary | ICD-10-CM | POA: Diagnosis not present

## 2019-02-06 DIAGNOSIS — G894 Chronic pain syndrome: Secondary | ICD-10-CM | POA: Diagnosis not present

## 2019-02-06 DIAGNOSIS — M069 Rheumatoid arthritis, unspecified: Secondary | ICD-10-CM | POA: Diagnosis not present

## 2019-02-17 DIAGNOSIS — R112 Nausea with vomiting, unspecified: Secondary | ICD-10-CM | POA: Diagnosis not present

## 2019-02-17 DIAGNOSIS — R1011 Right upper quadrant pain: Secondary | ICD-10-CM | POA: Diagnosis not present

## 2019-02-18 DIAGNOSIS — R1011 Right upper quadrant pain: Secondary | ICD-10-CM | POA: Diagnosis not present

## 2019-02-18 DIAGNOSIS — R112 Nausea with vomiting, unspecified: Secondary | ICD-10-CM | POA: Diagnosis not present

## 2019-02-18 DIAGNOSIS — E038 Other specified hypothyroidism: Secondary | ICD-10-CM | POA: Diagnosis not present

## 2019-03-10 DIAGNOSIS — B009 Herpesviral infection, unspecified: Secondary | ICD-10-CM | POA: Diagnosis not present

## 2019-03-10 DIAGNOSIS — L718 Other rosacea: Secondary | ICD-10-CM | POA: Diagnosis not present

## 2019-03-10 DIAGNOSIS — L578 Other skin changes due to chronic exposure to nonionizing radiation: Secondary | ICD-10-CM | POA: Diagnosis not present

## 2019-03-10 DIAGNOSIS — L603 Nail dystrophy: Secondary | ICD-10-CM | POA: Diagnosis not present

## 2019-03-14 DIAGNOSIS — M069 Rheumatoid arthritis, unspecified: Secondary | ICD-10-CM | POA: Diagnosis not present

## 2019-03-14 DIAGNOSIS — Z5181 Encounter for therapeutic drug level monitoring: Secondary | ICD-10-CM | POA: Diagnosis not present

## 2019-03-14 DIAGNOSIS — E038 Other specified hypothyroidism: Secondary | ICD-10-CM | POA: Diagnosis not present

## 2019-03-25 DIAGNOSIS — M545 Low back pain: Secondary | ICD-10-CM | POA: Diagnosis not present

## 2019-03-25 DIAGNOSIS — M2550 Pain in unspecified joint: Secondary | ICD-10-CM | POA: Diagnosis not present

## 2019-03-25 DIAGNOSIS — G894 Chronic pain syndrome: Secondary | ICD-10-CM | POA: Diagnosis not present

## 2019-03-25 DIAGNOSIS — M25511 Pain in right shoulder: Secondary | ICD-10-CM | POA: Diagnosis not present

## 2019-04-02 DIAGNOSIS — H2632 Drug-induced cataract, left eye: Secondary | ICD-10-CM | POA: Diagnosis not present

## 2019-04-02 DIAGNOSIS — H2513 Age-related nuclear cataract, bilateral: Secondary | ICD-10-CM | POA: Diagnosis not present

## 2019-04-02 DIAGNOSIS — H40033 Anatomical narrow angle, bilateral: Secondary | ICD-10-CM | POA: Diagnosis not present

## 2019-04-02 DIAGNOSIS — H0288A Meibomian gland dysfunction right eye, upper and lower eyelids: Secondary | ICD-10-CM | POA: Diagnosis not present

## 2019-04-02 DIAGNOSIS — H524 Presbyopia: Secondary | ICD-10-CM | POA: Diagnosis not present

## 2019-04-02 DIAGNOSIS — H25042 Posterior subcapsular polar age-related cataract, left eye: Secondary | ICD-10-CM | POA: Diagnosis not present

## 2019-04-02 DIAGNOSIS — B0229 Other postherpetic nervous system involvement: Secondary | ICD-10-CM | POA: Diagnosis not present

## 2019-04-02 DIAGNOSIS — H10823 Rosacea conjunctivitis, bilateral: Secondary | ICD-10-CM | POA: Diagnosis not present

## 2019-04-02 DIAGNOSIS — H0288B Meibomian gland dysfunction left eye, upper and lower eyelids: Secondary | ICD-10-CM | POA: Diagnosis not present

## 2019-04-02 DIAGNOSIS — H5203 Hypermetropia, bilateral: Secondary | ICD-10-CM | POA: Diagnosis not present

## 2019-04-21 DIAGNOSIS — B009 Herpesviral infection, unspecified: Secondary | ICD-10-CM | POA: Diagnosis not present

## 2019-04-21 DIAGNOSIS — B029 Zoster without complications: Secondary | ICD-10-CM | POA: Diagnosis not present

## 2019-05-21 DIAGNOSIS — G894 Chronic pain syndrome: Secondary | ICD-10-CM | POA: Diagnosis not present

## 2019-05-21 DIAGNOSIS — M25511 Pain in right shoulder: Secondary | ICD-10-CM | POA: Diagnosis not present

## 2019-05-21 DIAGNOSIS — M069 Rheumatoid arthritis, unspecified: Secondary | ICD-10-CM | POA: Diagnosis not present

## 2019-05-21 DIAGNOSIS — M545 Low back pain: Secondary | ICD-10-CM | POA: Diagnosis not present

## 2019-06-02 DIAGNOSIS — Z23 Encounter for immunization: Secondary | ICD-10-CM | POA: Diagnosis not present

## 2019-06-02 DIAGNOSIS — Z636 Dependent relative needing care at home: Secondary | ICD-10-CM | POA: Diagnosis not present

## 2019-06-02 DIAGNOSIS — M069 Rheumatoid arthritis, unspecified: Secondary | ICD-10-CM | POA: Diagnosis not present

## 2019-06-02 DIAGNOSIS — F419 Anxiety disorder, unspecified: Secondary | ICD-10-CM | POA: Diagnosis not present

## 2019-06-02 DIAGNOSIS — Z79899 Other long term (current) drug therapy: Secondary | ICD-10-CM | POA: Diagnosis not present

## 2019-06-02 DIAGNOSIS — F4323 Adjustment disorder with mixed anxiety and depressed mood: Secondary | ICD-10-CM | POA: Diagnosis not present

## 2019-07-16 DIAGNOSIS — M545 Low back pain: Secondary | ICD-10-CM | POA: Diagnosis not present

## 2019-07-16 DIAGNOSIS — M25511 Pain in right shoulder: Secondary | ICD-10-CM | POA: Diagnosis not present

## 2019-07-16 DIAGNOSIS — B0229 Other postherpetic nervous system involvement: Secondary | ICD-10-CM | POA: Diagnosis not present

## 2019-07-16 DIAGNOSIS — M069 Rheumatoid arthritis, unspecified: Secondary | ICD-10-CM | POA: Diagnosis not present

## 2019-07-18 DIAGNOSIS — Z5181 Encounter for therapeutic drug level monitoring: Secondary | ICD-10-CM | POA: Diagnosis not present

## 2019-07-18 DIAGNOSIS — E038 Other specified hypothyroidism: Secondary | ICD-10-CM | POA: Diagnosis not present

## 2019-07-18 DIAGNOSIS — M069 Rheumatoid arthritis, unspecified: Secondary | ICD-10-CM | POA: Diagnosis not present

## 2019-07-28 DIAGNOSIS — M069 Rheumatoid arthritis, unspecified: Secondary | ICD-10-CM | POA: Diagnosis not present

## 2019-07-28 DIAGNOSIS — F4323 Adjustment disorder with mixed anxiety and depressed mood: Secondary | ICD-10-CM | POA: Diagnosis not present

## 2019-07-28 DIAGNOSIS — I10 Essential (primary) hypertension: Secondary | ICD-10-CM | POA: Diagnosis not present

## 2019-07-28 DIAGNOSIS — Z600 Problems of adjustment to life-cycle transitions: Secondary | ICD-10-CM | POA: Diagnosis not present

## 2019-07-28 DIAGNOSIS — Z8619 Personal history of other infectious and parasitic diseases: Secondary | ICD-10-CM | POA: Diagnosis not present

## 2019-07-28 DIAGNOSIS — G894 Chronic pain syndrome: Secondary | ICD-10-CM | POA: Diagnosis not present

## 2019-07-28 DIAGNOSIS — Z85828 Personal history of other malignant neoplasm of skin: Secondary | ICD-10-CM | POA: Diagnosis not present

## 2019-07-28 DIAGNOSIS — M153 Secondary multiple arthritis: Secondary | ICD-10-CM | POA: Diagnosis not present

## 2019-07-30 DIAGNOSIS — G500 Trigeminal neuralgia: Secondary | ICD-10-CM | POA: Diagnosis not present

## 2019-07-30 DIAGNOSIS — B0229 Other postherpetic nervous system involvement: Secondary | ICD-10-CM | POA: Diagnosis not present

## 2019-08-25 DIAGNOSIS — M069 Rheumatoid arthritis, unspecified: Secondary | ICD-10-CM | POA: Diagnosis not present

## 2019-08-25 DIAGNOSIS — Z85828 Personal history of other malignant neoplasm of skin: Secondary | ICD-10-CM | POA: Diagnosis not present

## 2019-08-25 DIAGNOSIS — Z8619 Personal history of other infectious and parasitic diseases: Secondary | ICD-10-CM | POA: Diagnosis not present

## 2019-08-25 DIAGNOSIS — I10 Essential (primary) hypertension: Secondary | ICD-10-CM | POA: Diagnosis not present

## 2019-08-25 DIAGNOSIS — M153 Secondary multiple arthritis: Secondary | ICD-10-CM | POA: Diagnosis not present

## 2019-08-25 DIAGNOSIS — G894 Chronic pain syndrome: Secondary | ICD-10-CM | POA: Diagnosis not present

## 2019-08-25 DIAGNOSIS — F4323 Adjustment disorder with mixed anxiety and depressed mood: Secondary | ICD-10-CM | POA: Diagnosis not present

## 2019-08-25 DIAGNOSIS — Z600 Problems of adjustment to life-cycle transitions: Secondary | ICD-10-CM | POA: Diagnosis not present

## 2019-09-18 DIAGNOSIS — M069 Rheumatoid arthritis, unspecified: Secondary | ICD-10-CM | POA: Diagnosis not present

## 2019-09-18 DIAGNOSIS — M25511 Pain in right shoulder: Secondary | ICD-10-CM | POA: Diagnosis not present

## 2019-09-18 DIAGNOSIS — M545 Low back pain: Secondary | ICD-10-CM | POA: Diagnosis not present

## 2019-09-18 DIAGNOSIS — B0229 Other postherpetic nervous system involvement: Secondary | ICD-10-CM | POA: Diagnosis not present

## 2019-09-22 DIAGNOSIS — R5383 Other fatigue: Secondary | ICD-10-CM | POA: Diagnosis not present

## 2019-09-22 DIAGNOSIS — E038 Other specified hypothyroidism: Secondary | ICD-10-CM | POA: Diagnosis not present

## 2019-09-22 DIAGNOSIS — Z6841 Body Mass Index (BMI) 40.0 and over, adult: Secondary | ICD-10-CM | POA: Diagnosis not present

## 2019-09-22 DIAGNOSIS — Z5181 Encounter for therapeutic drug level monitoring: Secondary | ICD-10-CM | POA: Diagnosis not present

## 2019-12-02 DIAGNOSIS — M545 Low back pain: Secondary | ICD-10-CM | POA: Diagnosis not present

## 2019-12-02 DIAGNOSIS — M25511 Pain in right shoulder: Secondary | ICD-10-CM | POA: Diagnosis not present

## 2019-12-02 DIAGNOSIS — M069 Rheumatoid arthritis, unspecified: Secondary | ICD-10-CM | POA: Diagnosis not present

## 2019-12-02 DIAGNOSIS — M62838 Other muscle spasm: Secondary | ICD-10-CM | POA: Diagnosis not present

## 2019-12-02 DIAGNOSIS — B0229 Other postherpetic nervous system involvement: Secondary | ICD-10-CM | POA: Diagnosis not present

## 2019-12-02 DIAGNOSIS — Z5181 Encounter for therapeutic drug level monitoring: Secondary | ICD-10-CM | POA: Diagnosis not present

## 2019-12-02 DIAGNOSIS — Z79899 Other long term (current) drug therapy: Secondary | ICD-10-CM | POA: Diagnosis not present

## 2019-12-09 DIAGNOSIS — Z600 Problems of adjustment to life-cycle transitions: Secondary | ICD-10-CM | POA: Diagnosis not present

## 2019-12-09 DIAGNOSIS — F4323 Adjustment disorder with mixed anxiety and depressed mood: Secondary | ICD-10-CM | POA: Diagnosis not present

## 2019-12-10 DIAGNOSIS — H0288A Meibomian gland dysfunction right eye, upper and lower eyelids: Secondary | ICD-10-CM | POA: Diagnosis not present

## 2019-12-10 DIAGNOSIS — H43393 Other vitreous opacities, bilateral: Secondary | ICD-10-CM | POA: Diagnosis not present

## 2019-12-10 DIAGNOSIS — G501 Atypical facial pain: Secondary | ICD-10-CM | POA: Diagnosis not present

## 2019-12-10 DIAGNOSIS — H2513 Age-related nuclear cataract, bilateral: Secondary | ICD-10-CM | POA: Diagnosis not present

## 2019-12-10 DIAGNOSIS — H25042 Posterior subcapsular polar age-related cataract, left eye: Secondary | ICD-10-CM | POA: Diagnosis not present

## 2019-12-10 DIAGNOSIS — H0288B Meibomian gland dysfunction left eye, upper and lower eyelids: Secondary | ICD-10-CM | POA: Diagnosis not present

## 2019-12-29 DIAGNOSIS — M069 Rheumatoid arthritis, unspecified: Secondary | ICD-10-CM | POA: Diagnosis not present

## 2019-12-29 DIAGNOSIS — Z79899 Other long term (current) drug therapy: Secondary | ICD-10-CM | POA: Diagnosis not present

## 2019-12-29 DIAGNOSIS — Z7952 Long term (current) use of systemic steroids: Secondary | ICD-10-CM | POA: Diagnosis not present

## 2020-01-27 DIAGNOSIS — M545 Low back pain: Secondary | ICD-10-CM | POA: Diagnosis not present

## 2020-01-27 DIAGNOSIS — M069 Rheumatoid arthritis, unspecified: Secondary | ICD-10-CM | POA: Diagnosis not present

## 2020-01-27 DIAGNOSIS — G894 Chronic pain syndrome: Secondary | ICD-10-CM | POA: Diagnosis not present

## 2020-01-27 DIAGNOSIS — M25511 Pain in right shoulder: Secondary | ICD-10-CM | POA: Diagnosis not present

## 2020-03-01 DIAGNOSIS — L821 Other seborrheic keratosis: Secondary | ICD-10-CM | POA: Diagnosis not present

## 2020-03-01 DIAGNOSIS — D229 Melanocytic nevi, unspecified: Secondary | ICD-10-CM | POA: Diagnosis not present

## 2020-03-01 DIAGNOSIS — G548 Other nerve root and plexus disorders: Secondary | ICD-10-CM | POA: Diagnosis not present

## 2020-03-01 DIAGNOSIS — L304 Erythema intertrigo: Secondary | ICD-10-CM | POA: Diagnosis not present

## 2020-03-23 DIAGNOSIS — M25511 Pain in right shoulder: Secondary | ICD-10-CM | POA: Diagnosis not present

## 2020-03-23 DIAGNOSIS — M545 Low back pain, unspecified: Secondary | ICD-10-CM | POA: Diagnosis not present

## 2020-03-23 DIAGNOSIS — M069 Rheumatoid arthritis, unspecified: Secondary | ICD-10-CM | POA: Diagnosis not present

## 2020-03-23 DIAGNOSIS — B0229 Other postherpetic nervous system involvement: Secondary | ICD-10-CM | POA: Diagnosis not present

## 2020-03-25 DIAGNOSIS — E038 Other specified hypothyroidism: Secondary | ICD-10-CM | POA: Diagnosis not present

## 2020-03-30 DIAGNOSIS — E038 Other specified hypothyroidism: Secondary | ICD-10-CM | POA: Diagnosis not present

## 2020-03-30 DIAGNOSIS — Z5181 Encounter for therapeutic drug level monitoring: Secondary | ICD-10-CM | POA: Diagnosis not present

## 2020-03-30 DIAGNOSIS — R61 Generalized hyperhidrosis: Secondary | ICD-10-CM | POA: Diagnosis not present

## 2020-03-30 DIAGNOSIS — M069 Rheumatoid arthritis, unspecified: Secondary | ICD-10-CM | POA: Diagnosis not present

## 2020-03-30 DIAGNOSIS — R5382 Chronic fatigue, unspecified: Secondary | ICD-10-CM | POA: Diagnosis not present

## 2020-04-02 DIAGNOSIS — H04123 Dry eye syndrome of bilateral lacrimal glands: Secondary | ICD-10-CM | POA: Diagnosis not present

## 2020-04-02 DIAGNOSIS — H2513 Age-related nuclear cataract, bilateral: Secondary | ICD-10-CM | POA: Diagnosis not present

## 2020-04-02 DIAGNOSIS — H5203 Hypermetropia, bilateral: Secondary | ICD-10-CM | POA: Diagnosis not present

## 2020-04-02 DIAGNOSIS — H0288A Meibomian gland dysfunction right eye, upper and lower eyelids: Secondary | ICD-10-CM | POA: Diagnosis not present

## 2020-04-02 DIAGNOSIS — B0229 Other postherpetic nervous system involvement: Secondary | ICD-10-CM | POA: Diagnosis not present

## 2020-04-02 DIAGNOSIS — H40033 Anatomical narrow angle, bilateral: Secondary | ICD-10-CM | POA: Diagnosis not present

## 2020-04-02 DIAGNOSIS — H43393 Other vitreous opacities, bilateral: Secondary | ICD-10-CM | POA: Diagnosis not present

## 2020-04-02 DIAGNOSIS — H524 Presbyopia: Secondary | ICD-10-CM | POA: Diagnosis not present

## 2020-04-02 DIAGNOSIS — H0288B Meibomian gland dysfunction left eye, upper and lower eyelids: Secondary | ICD-10-CM | POA: Diagnosis not present

## 2020-04-29 DIAGNOSIS — R61 Generalized hyperhidrosis: Secondary | ICD-10-CM | POA: Diagnosis not present

## 2020-04-29 DIAGNOSIS — M069 Rheumatoid arthritis, unspecified: Secondary | ICD-10-CM | POA: Diagnosis not present

## 2020-04-29 DIAGNOSIS — F321 Major depressive disorder, single episode, moderate: Secondary | ICD-10-CM | POA: Diagnosis not present

## 2020-04-29 DIAGNOSIS — Z23 Encounter for immunization: Secondary | ICD-10-CM | POA: Diagnosis not present

## 2020-04-29 DIAGNOSIS — F1120 Opioid dependence, uncomplicated: Secondary | ICD-10-CM | POA: Diagnosis not present

## 2020-04-29 DIAGNOSIS — E038 Other specified hypothyroidism: Secondary | ICD-10-CM | POA: Diagnosis not present

## 2020-04-29 DIAGNOSIS — D849 Immunodeficiency, unspecified: Secondary | ICD-10-CM | POA: Diagnosis not present

## 2020-04-29 DIAGNOSIS — Z5181 Encounter for therapeutic drug level monitoring: Secondary | ICD-10-CM | POA: Diagnosis not present

## 2020-05-20 DIAGNOSIS — Z780 Asymptomatic menopausal state: Secondary | ICD-10-CM | POA: Diagnosis not present

## 2020-05-20 DIAGNOSIS — Z7952 Long term (current) use of systemic steroids: Secondary | ICD-10-CM | POA: Diagnosis not present

## 2020-05-20 DIAGNOSIS — M810 Age-related osteoporosis without current pathological fracture: Secondary | ICD-10-CM | POA: Diagnosis not present

## 2020-06-30 DIAGNOSIS — Z79899 Other long term (current) drug therapy: Secondary | ICD-10-CM | POA: Diagnosis not present

## 2020-06-30 DIAGNOSIS — Z7952 Long term (current) use of systemic steroids: Secondary | ICD-10-CM | POA: Diagnosis not present

## 2020-06-30 DIAGNOSIS — M8589 Other specified disorders of bone density and structure, multiple sites: Secondary | ICD-10-CM | POA: Diagnosis not present

## 2020-06-30 DIAGNOSIS — M0609 Rheumatoid arthritis without rheumatoid factor, multiple sites: Secondary | ICD-10-CM | POA: Diagnosis not present

## 2020-07-01 DIAGNOSIS — M25512 Pain in left shoulder: Secondary | ICD-10-CM | POA: Diagnosis not present

## 2020-07-01 DIAGNOSIS — G8929 Other chronic pain: Secondary | ICD-10-CM | POA: Diagnosis not present

## 2020-07-01 DIAGNOSIS — G894 Chronic pain syndrome: Secondary | ICD-10-CM | POA: Diagnosis not present

## 2020-07-01 DIAGNOSIS — B0229 Other postherpetic nervous system involvement: Secondary | ICD-10-CM | POA: Diagnosis not present

## 2020-07-01 DIAGNOSIS — Z5181 Encounter for therapeutic drug level monitoring: Secondary | ICD-10-CM | POA: Diagnosis not present

## 2020-07-01 DIAGNOSIS — Z79899 Other long term (current) drug therapy: Secondary | ICD-10-CM | POA: Diagnosis not present

## 2020-07-01 DIAGNOSIS — M069 Rheumatoid arthritis, unspecified: Secondary | ICD-10-CM | POA: Diagnosis not present

## 2020-07-01 DIAGNOSIS — M545 Low back pain, unspecified: Secondary | ICD-10-CM | POA: Diagnosis not present

## 2020-07-01 DIAGNOSIS — M25511 Pain in right shoulder: Secondary | ICD-10-CM | POA: Diagnosis not present

## 2020-07-13 DIAGNOSIS — E221 Hyperprolactinemia: Secondary | ICD-10-CM | POA: Diagnosis not present

## 2020-07-13 DIAGNOSIS — Z5181 Encounter for therapeutic drug level monitoring: Secondary | ICD-10-CM | POA: Diagnosis not present

## 2020-07-13 DIAGNOSIS — M069 Rheumatoid arthritis, unspecified: Secondary | ICD-10-CM | POA: Diagnosis not present

## 2020-07-13 DIAGNOSIS — R635 Abnormal weight gain: Secondary | ICD-10-CM | POA: Diagnosis not present

## 2020-07-13 DIAGNOSIS — R5382 Chronic fatigue, unspecified: Secondary | ICD-10-CM | POA: Diagnosis not present

## 2020-07-13 DIAGNOSIS — E038 Other specified hypothyroidism: Secondary | ICD-10-CM | POA: Diagnosis not present

## 2020-09-07 DIAGNOSIS — E038 Other specified hypothyroidism: Secondary | ICD-10-CM | POA: Diagnosis not present

## 2020-09-07 DIAGNOSIS — E221 Hyperprolactinemia: Secondary | ICD-10-CM | POA: Diagnosis not present

## 2020-09-08 DIAGNOSIS — L304 Erythema intertrigo: Secondary | ICD-10-CM | POA: Diagnosis not present

## 2020-09-09 DIAGNOSIS — Z5181 Encounter for therapeutic drug level monitoring: Secondary | ICD-10-CM | POA: Diagnosis not present

## 2020-09-09 DIAGNOSIS — E221 Hyperprolactinemia: Secondary | ICD-10-CM | POA: Diagnosis not present

## 2020-09-09 DIAGNOSIS — E038 Other specified hypothyroidism: Secondary | ICD-10-CM | POA: Diagnosis not present

## 2020-09-09 DIAGNOSIS — M069 Rheumatoid arthritis, unspecified: Secondary | ICD-10-CM | POA: Diagnosis not present

## 2020-09-23 DIAGNOSIS — Z79899 Other long term (current) drug therapy: Secondary | ICD-10-CM | POA: Diagnosis not present

## 2020-09-23 DIAGNOSIS — G8929 Other chronic pain: Secondary | ICD-10-CM | POA: Diagnosis not present

## 2020-09-23 DIAGNOSIS — M25512 Pain in left shoulder: Secondary | ICD-10-CM | POA: Diagnosis not present

## 2020-09-23 DIAGNOSIS — M545 Low back pain, unspecified: Secondary | ICD-10-CM | POA: Diagnosis not present

## 2020-09-23 DIAGNOSIS — Z79891 Long term (current) use of opiate analgesic: Secondary | ICD-10-CM | POA: Diagnosis not present

## 2020-09-23 DIAGNOSIS — M069 Rheumatoid arthritis, unspecified: Secondary | ICD-10-CM | POA: Diagnosis not present

## 2020-09-23 DIAGNOSIS — G894 Chronic pain syndrome: Secondary | ICD-10-CM | POA: Diagnosis not present

## 2020-09-23 DIAGNOSIS — M25511 Pain in right shoulder: Secondary | ICD-10-CM | POA: Diagnosis not present

## 2020-12-02 DIAGNOSIS — Z79891 Long term (current) use of opiate analgesic: Secondary | ICD-10-CM | POA: Diagnosis not present

## 2020-12-02 DIAGNOSIS — G8929 Other chronic pain: Secondary | ICD-10-CM | POA: Diagnosis not present

## 2020-12-02 DIAGNOSIS — B0229 Other postherpetic nervous system involvement: Secondary | ICD-10-CM | POA: Diagnosis not present

## 2020-12-02 DIAGNOSIS — G894 Chronic pain syndrome: Secondary | ICD-10-CM | POA: Diagnosis not present

## 2020-12-02 DIAGNOSIS — M62838 Other muscle spasm: Secondary | ICD-10-CM | POA: Diagnosis not present

## 2020-12-02 DIAGNOSIS — M25512 Pain in left shoulder: Secondary | ICD-10-CM | POA: Diagnosis not present

## 2020-12-02 DIAGNOSIS — M25511 Pain in right shoulder: Secondary | ICD-10-CM | POA: Diagnosis not present

## 2020-12-02 DIAGNOSIS — M545 Low back pain, unspecified: Secondary | ICD-10-CM | POA: Diagnosis not present

## 2020-12-02 DIAGNOSIS — M069 Rheumatoid arthritis, unspecified: Secondary | ICD-10-CM | POA: Diagnosis not present

## 2020-12-28 DIAGNOSIS — Z981 Arthrodesis status: Secondary | ICD-10-CM | POA: Diagnosis not present

## 2020-12-28 DIAGNOSIS — M545 Low back pain, unspecified: Secondary | ICD-10-CM | POA: Diagnosis not present

## 2020-12-28 DIAGNOSIS — Z79899 Other long term (current) drug therapy: Secondary | ICD-10-CM | POA: Diagnosis not present

## 2020-12-28 DIAGNOSIS — M0609 Rheumatoid arthritis without rheumatoid factor, multiple sites: Secondary | ICD-10-CM | POA: Diagnosis not present

## 2020-12-28 DIAGNOSIS — M8589 Other specified disorders of bone density and structure, multiple sites: Secondary | ICD-10-CM | POA: Diagnosis not present

## 2020-12-28 DIAGNOSIS — Z7952 Long term (current) use of systemic steroids: Secondary | ICD-10-CM | POA: Diagnosis not present

## 2020-12-28 DIAGNOSIS — M170 Bilateral primary osteoarthritis of knee: Secondary | ICD-10-CM | POA: Diagnosis not present

## 2020-12-28 DIAGNOSIS — G894 Chronic pain syndrome: Secondary | ICD-10-CM | POA: Diagnosis not present

## 2021-01-06 DIAGNOSIS — Z600 Problems of adjustment to life-cycle transitions: Secondary | ICD-10-CM | POA: Diagnosis not present

## 2021-01-06 DIAGNOSIS — F4323 Adjustment disorder with mixed anxiety and depressed mood: Secondary | ICD-10-CM | POA: Diagnosis not present

## 2021-02-17 DIAGNOSIS — M069 Rheumatoid arthritis, unspecified: Secondary | ICD-10-CM | POA: Diagnosis not present

## 2021-02-17 DIAGNOSIS — M25512 Pain in left shoulder: Secondary | ICD-10-CM | POA: Diagnosis not present

## 2021-02-17 DIAGNOSIS — M5136 Other intervertebral disc degeneration, lumbar region: Secondary | ICD-10-CM | POA: Diagnosis not present

## 2021-02-17 DIAGNOSIS — M545 Low back pain, unspecified: Secondary | ICD-10-CM | POA: Diagnosis not present

## 2021-02-17 DIAGNOSIS — B0229 Other postherpetic nervous system involvement: Secondary | ICD-10-CM | POA: Diagnosis not present

## 2021-02-17 DIAGNOSIS — M4186 Other forms of scoliosis, lumbar region: Secondary | ICD-10-CM | POA: Diagnosis not present

## 2021-02-17 DIAGNOSIS — M25511 Pain in right shoulder: Secondary | ICD-10-CM | POA: Diagnosis not present

## 2021-02-17 DIAGNOSIS — Z981 Arthrodesis status: Secondary | ICD-10-CM | POA: Diagnosis not present

## 2021-02-17 DIAGNOSIS — G8929 Other chronic pain: Secondary | ICD-10-CM | POA: Diagnosis not present

## 2021-02-17 DIAGNOSIS — M4316 Spondylolisthesis, lumbar region: Secondary | ICD-10-CM | POA: Diagnosis not present

## 2021-02-23 DIAGNOSIS — F321 Major depressive disorder, single episode, moderate: Secondary | ICD-10-CM | POA: Diagnosis not present

## 2021-02-23 DIAGNOSIS — M21371 Foot drop, right foot: Secondary | ICD-10-CM | POA: Diagnosis not present

## 2021-02-23 DIAGNOSIS — D849 Immunodeficiency, unspecified: Secondary | ICD-10-CM | POA: Diagnosis not present

## 2021-02-23 DIAGNOSIS — Z6841 Body Mass Index (BMI) 40.0 and over, adult: Secondary | ICD-10-CM | POA: Diagnosis not present

## 2021-02-23 DIAGNOSIS — F1120 Opioid dependence, uncomplicated: Secondary | ICD-10-CM | POA: Diagnosis not present

## 2021-02-23 DIAGNOSIS — G894 Chronic pain syndrome: Secondary | ICD-10-CM | POA: Diagnosis not present

## 2021-02-23 DIAGNOSIS — Z1231 Encounter for screening mammogram for malignant neoplasm of breast: Secondary | ICD-10-CM | POA: Diagnosis not present

## 2021-02-23 DIAGNOSIS — R5383 Other fatigue: Secondary | ICD-10-CM | POA: Diagnosis not present

## 2021-02-23 DIAGNOSIS — G629 Polyneuropathy, unspecified: Secondary | ICD-10-CM | POA: Diagnosis not present

## 2021-03-11 DIAGNOSIS — E038 Other specified hypothyroidism: Secondary | ICD-10-CM | POA: Diagnosis not present

## 2021-03-11 DIAGNOSIS — Z5181 Encounter for therapeutic drug level monitoring: Secondary | ICD-10-CM | POA: Diagnosis not present

## 2021-03-17 DIAGNOSIS — E038 Other specified hypothyroidism: Secondary | ICD-10-CM | POA: Diagnosis not present

## 2021-03-17 DIAGNOSIS — M069 Rheumatoid arthritis, unspecified: Secondary | ICD-10-CM | POA: Diagnosis not present

## 2021-03-17 DIAGNOSIS — R638 Other symptoms and signs concerning food and fluid intake: Secondary | ICD-10-CM | POA: Diagnosis not present

## 2021-03-17 DIAGNOSIS — Z5181 Encounter for therapeutic drug level monitoring: Secondary | ICD-10-CM | POA: Diagnosis not present

## 2021-03-17 DIAGNOSIS — R5382 Chronic fatigue, unspecified: Secondary | ICD-10-CM | POA: Diagnosis not present

## 2021-03-21 DIAGNOSIS — L738 Other specified follicular disorders: Secondary | ICD-10-CM | POA: Diagnosis not present

## 2021-03-21 DIAGNOSIS — L718 Other rosacea: Secondary | ICD-10-CM | POA: Diagnosis not present

## 2021-04-11 DIAGNOSIS — M533 Sacrococcygeal disorders, not elsewhere classified: Secondary | ICD-10-CM | POA: Diagnosis not present

## 2021-04-26 DIAGNOSIS — B0229 Other postherpetic nervous system involvement: Secondary | ICD-10-CM | POA: Diagnosis not present

## 2021-04-26 DIAGNOSIS — M25511 Pain in right shoulder: Secondary | ICD-10-CM | POA: Diagnosis not present

## 2021-04-26 DIAGNOSIS — Z79899 Other long term (current) drug therapy: Secondary | ICD-10-CM | POA: Diagnosis not present

## 2021-04-26 DIAGNOSIS — M533 Sacrococcygeal disorders, not elsewhere classified: Secondary | ICD-10-CM | POA: Diagnosis not present

## 2021-04-26 DIAGNOSIS — G8929 Other chronic pain: Secondary | ICD-10-CM | POA: Diagnosis not present

## 2021-04-26 DIAGNOSIS — M25512 Pain in left shoulder: Secondary | ICD-10-CM | POA: Diagnosis not present

## 2021-04-26 DIAGNOSIS — M069 Rheumatoid arthritis, unspecified: Secondary | ICD-10-CM | POA: Diagnosis not present

## 2021-04-26 DIAGNOSIS — M545 Low back pain, unspecified: Secondary | ICD-10-CM | POA: Diagnosis not present

## 2021-04-26 DIAGNOSIS — Z79891 Long term (current) use of opiate analgesic: Secondary | ICD-10-CM | POA: Diagnosis not present

## 2021-05-18 DIAGNOSIS — H5203 Hypermetropia, bilateral: Secondary | ICD-10-CM | POA: Diagnosis not present

## 2021-05-18 DIAGNOSIS — H43393 Other vitreous opacities, bilateral: Secondary | ICD-10-CM | POA: Diagnosis not present

## 2021-05-18 DIAGNOSIS — H04123 Dry eye syndrome of bilateral lacrimal glands: Secondary | ICD-10-CM | POA: Diagnosis not present

## 2021-05-18 DIAGNOSIS — H2513 Age-related nuclear cataract, bilateral: Secondary | ICD-10-CM | POA: Diagnosis not present

## 2021-05-18 DIAGNOSIS — H35363 Drusen (degenerative) of macula, bilateral: Secondary | ICD-10-CM | POA: Diagnosis not present

## 2021-05-18 DIAGNOSIS — H40033 Anatomical narrow angle, bilateral: Secondary | ICD-10-CM | POA: Diagnosis not present

## 2021-05-18 DIAGNOSIS — B0229 Other postherpetic nervous system involvement: Secondary | ICD-10-CM | POA: Diagnosis not present

## 2021-05-18 DIAGNOSIS — H524 Presbyopia: Secondary | ICD-10-CM | POA: Diagnosis not present
